# Patient Record
Sex: Male | Born: 1935 | Race: Black or African American | Hispanic: No | Marital: Married
Health system: Southern US, Community
[De-identification: ages and names within clinical notes are randomized; demographics above are authoritative.]

## PROBLEM LIST (undated history)

## (undated) DIAGNOSIS — R6520 Severe sepsis without septic shock: Secondary | ICD-10-CM

## (undated) DIAGNOSIS — J690 Pneumonitis due to inhalation of food and vomit: Secondary | ICD-10-CM

## (undated) DIAGNOSIS — F0390 Unspecified dementia without behavioral disturbance: Secondary | ICD-10-CM

## (undated) DIAGNOSIS — A419 Sepsis, unspecified organism: Secondary | ICD-10-CM

## (undated) DIAGNOSIS — J9621 Acute and chronic respiratory failure with hypoxia: Secondary | ICD-10-CM

---

## 2009-06-06 ENCOUNTER — Emergency Department (HOSPITAL_COMMUNITY): Admission: EM | Admit: 2009-06-06 | Discharge: 2009-06-06 | Payer: Self-pay | Admitting: Emergency Medicine

## 2011-03-11 DIAGNOSIS — Z23 Encounter for immunization: Secondary | ICD-10-CM | POA: Diagnosis not present

## 2011-05-11 DIAGNOSIS — H4011X0 Primary open-angle glaucoma, stage unspecified: Secondary | ICD-10-CM | POA: Diagnosis not present

## 2011-07-05 DIAGNOSIS — E785 Hyperlipidemia, unspecified: Secondary | ICD-10-CM | POA: Diagnosis not present

## 2011-07-05 DIAGNOSIS — Z6825 Body mass index (BMI) 25.0-25.9, adult: Secondary | ICD-10-CM | POA: Diagnosis not present

## 2011-07-05 DIAGNOSIS — C61 Malignant neoplasm of prostate: Secondary | ICD-10-CM | POA: Diagnosis not present

## 2011-07-05 DIAGNOSIS — E039 Hypothyroidism, unspecified: Secondary | ICD-10-CM | POA: Diagnosis not present

## 2011-08-02 DIAGNOSIS — G309 Alzheimer's disease, unspecified: Secondary | ICD-10-CM | POA: Diagnosis not present

## 2011-08-02 DIAGNOSIS — F0280 Dementia in other diseases classified elsewhere without behavioral disturbance: Secondary | ICD-10-CM | POA: Diagnosis not present

## 2011-08-02 DIAGNOSIS — Z6825 Body mass index (BMI) 25.0-25.9, adult: Secondary | ICD-10-CM | POA: Diagnosis not present

## 2011-08-07 DIAGNOSIS — R5381 Other malaise: Secondary | ICD-10-CM | POA: Diagnosis not present

## 2011-08-07 DIAGNOSIS — N390 Urinary tract infection, site not specified: Secondary | ICD-10-CM | POA: Diagnosis not present

## 2011-08-07 DIAGNOSIS — C61 Malignant neoplasm of prostate: Secondary | ICD-10-CM | POA: Diagnosis not present

## 2011-08-07 DIAGNOSIS — N529 Male erectile dysfunction, unspecified: Secondary | ICD-10-CM | POA: Diagnosis not present

## 2011-08-07 DIAGNOSIS — R5383 Other fatigue: Secondary | ICD-10-CM | POA: Diagnosis not present

## 2011-08-21 DIAGNOSIS — H4011X0 Primary open-angle glaucoma, stage unspecified: Secondary | ICD-10-CM | POA: Diagnosis not present

## 2011-08-26 DIAGNOSIS — S0003XA Contusion of scalp, initial encounter: Secondary | ICD-10-CM | POA: Diagnosis not present

## 2011-08-26 DIAGNOSIS — W010XXA Fall on same level from slipping, tripping and stumbling without subsequent striking against object, initial encounter: Secondary | ICD-10-CM | POA: Diagnosis not present

## 2011-08-26 DIAGNOSIS — S1093XA Contusion of unspecified part of neck, initial encounter: Secondary | ICD-10-CM | POA: Diagnosis not present

## 2011-08-26 DIAGNOSIS — S0083XA Contusion of other part of head, initial encounter: Secondary | ICD-10-CM | POA: Diagnosis not present

## 2011-08-30 DIAGNOSIS — S0003XA Contusion of scalp, initial encounter: Secondary | ICD-10-CM | POA: Diagnosis not present

## 2011-08-30 DIAGNOSIS — G309 Alzheimer's disease, unspecified: Secondary | ICD-10-CM | POA: Diagnosis not present

## 2011-08-30 DIAGNOSIS — Z6825 Body mass index (BMI) 25.0-25.9, adult: Secondary | ICD-10-CM | POA: Diagnosis not present

## 2011-08-30 DIAGNOSIS — S1093XA Contusion of unspecified part of neck, initial encounter: Secondary | ICD-10-CM | POA: Diagnosis not present

## 2011-09-27 DIAGNOSIS — E785 Hyperlipidemia, unspecified: Secondary | ICD-10-CM | POA: Diagnosis not present

## 2011-09-27 DIAGNOSIS — E039 Hypothyroidism, unspecified: Secondary | ICD-10-CM | POA: Diagnosis not present

## 2011-09-27 DIAGNOSIS — F0280 Dementia in other diseases classified elsewhere without behavioral disturbance: Secondary | ICD-10-CM | POA: Diagnosis not present

## 2011-09-27 DIAGNOSIS — G309 Alzheimer's disease, unspecified: Secondary | ICD-10-CM | POA: Diagnosis not present

## 2011-09-27 DIAGNOSIS — IMO0002 Reserved for concepts with insufficient information to code with codable children: Secondary | ICD-10-CM | POA: Diagnosis not present

## 2011-10-19 DIAGNOSIS — L600 Ingrowing nail: Secondary | ICD-10-CM | POA: Diagnosis not present

## 2011-11-01 DIAGNOSIS — F0280 Dementia in other diseases classified elsewhere without behavioral disturbance: Secondary | ICD-10-CM | POA: Diagnosis not present

## 2011-11-01 DIAGNOSIS — G309 Alzheimer's disease, unspecified: Secondary | ICD-10-CM | POA: Diagnosis not present

## 2011-11-01 DIAGNOSIS — R42 Dizziness and giddiness: Secondary | ICD-10-CM | POA: Diagnosis not present

## 2011-11-02 DIAGNOSIS — L600 Ingrowing nail: Secondary | ICD-10-CM | POA: Diagnosis not present

## 2011-11-03 DIAGNOSIS — R42 Dizziness and giddiness: Secondary | ICD-10-CM | POA: Diagnosis not present

## 2011-11-03 DIAGNOSIS — G309 Alzheimer's disease, unspecified: Secondary | ICD-10-CM | POA: Diagnosis not present

## 2011-11-03 DIAGNOSIS — F0280 Dementia in other diseases classified elsewhere without behavioral disturbance: Secondary | ICD-10-CM | POA: Diagnosis not present

## 2011-11-03 DIAGNOSIS — Z8673 Personal history of transient ischemic attack (TIA), and cerebral infarction without residual deficits: Secondary | ICD-10-CM | POA: Diagnosis not present

## 2011-11-03 DIAGNOSIS — R930 Abnormal findings on diagnostic imaging of skull and head, not elsewhere classified: Secondary | ICD-10-CM | POA: Diagnosis not present

## 2011-11-07 DIAGNOSIS — F0280 Dementia in other diseases classified elsewhere without behavioral disturbance: Secondary | ICD-10-CM | POA: Diagnosis not present

## 2011-11-07 DIAGNOSIS — G309 Alzheimer's disease, unspecified: Secondary | ICD-10-CM | POA: Diagnosis not present

## 2011-11-07 DIAGNOSIS — R42 Dizziness and giddiness: Secondary | ICD-10-CM | POA: Diagnosis not present

## 2011-11-22 DIAGNOSIS — H4011X0 Primary open-angle glaucoma, stage unspecified: Secondary | ICD-10-CM | POA: Diagnosis not present

## 2011-11-23 DIAGNOSIS — Z23 Encounter for immunization: Secondary | ICD-10-CM | POA: Diagnosis not present

## 2011-12-19 DIAGNOSIS — R42 Dizziness and giddiness: Secondary | ICD-10-CM | POA: Diagnosis not present

## 2011-12-19 DIAGNOSIS — G309 Alzheimer's disease, unspecified: Secondary | ICD-10-CM | POA: Diagnosis not present

## 2011-12-19 DIAGNOSIS — F0280 Dementia in other diseases classified elsewhere without behavioral disturbance: Secondary | ICD-10-CM | POA: Diagnosis not present

## 2012-01-04 DIAGNOSIS — G309 Alzheimer's disease, unspecified: Secondary | ICD-10-CM | POA: Diagnosis not present

## 2012-01-04 DIAGNOSIS — F0280 Dementia in other diseases classified elsewhere without behavioral disturbance: Secondary | ICD-10-CM | POA: Diagnosis not present

## 2012-01-04 DIAGNOSIS — E039 Hypothyroidism, unspecified: Secondary | ICD-10-CM | POA: Diagnosis not present

## 2012-01-04 DIAGNOSIS — Z6827 Body mass index (BMI) 27.0-27.9, adult: Secondary | ICD-10-CM | POA: Diagnosis not present

## 2012-01-04 DIAGNOSIS — E785 Hyperlipidemia, unspecified: Secondary | ICD-10-CM | POA: Diagnosis not present

## 2012-02-05 DIAGNOSIS — C61 Malignant neoplasm of prostate: Secondary | ICD-10-CM | POA: Diagnosis not present

## 2012-02-05 DIAGNOSIS — R5381 Other malaise: Secondary | ICD-10-CM | POA: Diagnosis not present

## 2012-02-05 DIAGNOSIS — R5383 Other fatigue: Secondary | ICD-10-CM | POA: Diagnosis not present

## 2012-02-05 DIAGNOSIS — N529 Male erectile dysfunction, unspecified: Secondary | ICD-10-CM | POA: Diagnosis not present

## 2012-02-05 DIAGNOSIS — N390 Urinary tract infection, site not specified: Secondary | ICD-10-CM | POA: Diagnosis not present

## 2012-02-23 DIAGNOSIS — H4011X0 Primary open-angle glaucoma, stage unspecified: Secondary | ICD-10-CM | POA: Diagnosis not present

## 2012-02-27 DIAGNOSIS — G309 Alzheimer's disease, unspecified: Secondary | ICD-10-CM | POA: Diagnosis not present

## 2012-02-27 DIAGNOSIS — R42 Dizziness and giddiness: Secondary | ICD-10-CM | POA: Diagnosis not present

## 2012-03-22 DIAGNOSIS — Z6826 Body mass index (BMI) 26.0-26.9, adult: Secondary | ICD-10-CM | POA: Diagnosis not present

## 2012-03-22 DIAGNOSIS — E039 Hypothyroidism, unspecified: Secondary | ICD-10-CM | POA: Diagnosis not present

## 2012-04-17 DIAGNOSIS — E039 Hypothyroidism, unspecified: Secondary | ICD-10-CM | POA: Diagnosis not present

## 2012-05-23 DIAGNOSIS — H4011X0 Primary open-angle glaucoma, stage unspecified: Secondary | ICD-10-CM | POA: Diagnosis not present

## 2012-08-02 DIAGNOSIS — Z6826 Body mass index (BMI) 26.0-26.9, adult: Secondary | ICD-10-CM | POA: Diagnosis not present

## 2012-08-02 DIAGNOSIS — E785 Hyperlipidemia, unspecified: Secondary | ICD-10-CM | POA: Diagnosis not present

## 2012-08-02 DIAGNOSIS — Z9181 History of falling: Secondary | ICD-10-CM | POA: Diagnosis not present

## 2012-08-02 DIAGNOSIS — G309 Alzheimer's disease, unspecified: Secondary | ICD-10-CM | POA: Diagnosis not present

## 2012-08-02 DIAGNOSIS — Z1331 Encounter for screening for depression: Secondary | ICD-10-CM | POA: Diagnosis not present

## 2012-08-02 DIAGNOSIS — F0280 Dementia in other diseases classified elsewhere without behavioral disturbance: Secondary | ICD-10-CM | POA: Diagnosis not present

## 2012-08-02 DIAGNOSIS — E039 Hypothyroidism, unspecified: Secondary | ICD-10-CM | POA: Diagnosis not present

## 2012-08-05 DIAGNOSIS — N390 Urinary tract infection, site not specified: Secondary | ICD-10-CM | POA: Diagnosis not present

## 2012-08-05 DIAGNOSIS — C61 Malignant neoplasm of prostate: Secondary | ICD-10-CM | POA: Diagnosis not present

## 2012-08-05 DIAGNOSIS — R972 Elevated prostate specific antigen [PSA]: Secondary | ICD-10-CM | POA: Diagnosis not present

## 2012-08-05 DIAGNOSIS — R5381 Other malaise: Secondary | ICD-10-CM | POA: Diagnosis not present

## 2012-08-05 DIAGNOSIS — R5383 Other fatigue: Secondary | ICD-10-CM | POA: Diagnosis not present

## 2012-08-26 DIAGNOSIS — H4011X0 Primary open-angle glaucoma, stage unspecified: Secondary | ICD-10-CM | POA: Diagnosis not present

## 2012-11-06 DIAGNOSIS — Z6826 Body mass index (BMI) 26.0-26.9, adult: Secondary | ICD-10-CM | POA: Diagnosis not present

## 2012-11-06 DIAGNOSIS — F0280 Dementia in other diseases classified elsewhere without behavioral disturbance: Secondary | ICD-10-CM | POA: Diagnosis not present

## 2012-11-06 DIAGNOSIS — E785 Hyperlipidemia, unspecified: Secondary | ICD-10-CM | POA: Diagnosis not present

## 2012-11-06 DIAGNOSIS — E039 Hypothyroidism, unspecified: Secondary | ICD-10-CM | POA: Diagnosis not present

## 2012-11-06 DIAGNOSIS — H6120 Impacted cerumen, unspecified ear: Secondary | ICD-10-CM | POA: Diagnosis not present

## 2012-11-26 DIAGNOSIS — H4011X0 Primary open-angle glaucoma, stage unspecified: Secondary | ICD-10-CM | POA: Diagnosis not present

## 2013-01-01 DIAGNOSIS — Z23 Encounter for immunization: Secondary | ICD-10-CM | POA: Diagnosis not present

## 2013-02-04 DIAGNOSIS — R5383 Other fatigue: Secondary | ICD-10-CM | POA: Diagnosis not present

## 2013-02-04 DIAGNOSIS — C61 Malignant neoplasm of prostate: Secondary | ICD-10-CM | POA: Diagnosis not present

## 2013-02-04 DIAGNOSIS — R5381 Other malaise: Secondary | ICD-10-CM | POA: Diagnosis not present

## 2013-02-04 DIAGNOSIS — N529 Male erectile dysfunction, unspecified: Secondary | ICD-10-CM | POA: Diagnosis not present

## 2013-02-04 DIAGNOSIS — N390 Urinary tract infection, site not specified: Secondary | ICD-10-CM | POA: Diagnosis not present

## 2013-02-17 DIAGNOSIS — E785 Hyperlipidemia, unspecified: Secondary | ICD-10-CM | POA: Diagnosis not present

## 2013-02-17 DIAGNOSIS — F0280 Dementia in other diseases classified elsewhere without behavioral disturbance: Secondary | ICD-10-CM | POA: Diagnosis not present

## 2013-02-17 DIAGNOSIS — E039 Hypothyroidism, unspecified: Secondary | ICD-10-CM | POA: Diagnosis not present

## 2013-02-26 DIAGNOSIS — H4011X0 Primary open-angle glaucoma, stage unspecified: Secondary | ICD-10-CM | POA: Diagnosis not present

## 2013-05-20 DIAGNOSIS — Z6827 Body mass index (BMI) 27.0-27.9, adult: Secondary | ICD-10-CM | POA: Diagnosis not present

## 2013-05-20 DIAGNOSIS — E039 Hypothyroidism, unspecified: Secondary | ICD-10-CM | POA: Diagnosis not present

## 2013-05-20 DIAGNOSIS — C61 Malignant neoplasm of prostate: Secondary | ICD-10-CM | POA: Diagnosis not present

## 2013-05-20 DIAGNOSIS — E785 Hyperlipidemia, unspecified: Secondary | ICD-10-CM | POA: Diagnosis not present

## 2013-05-20 DIAGNOSIS — F0280 Dementia in other diseases classified elsewhere without behavioral disturbance: Secondary | ICD-10-CM | POA: Diagnosis not present

## 2013-05-20 DIAGNOSIS — G309 Alzheimer's disease, unspecified: Secondary | ICD-10-CM | POA: Diagnosis not present

## 2013-06-05 DIAGNOSIS — H4011X0 Primary open-angle glaucoma, stage unspecified: Secondary | ICD-10-CM | POA: Diagnosis not present

## 2013-08-05 DIAGNOSIS — R5383 Other fatigue: Secondary | ICD-10-CM | POA: Diagnosis not present

## 2013-08-05 DIAGNOSIS — R5381 Other malaise: Secondary | ICD-10-CM | POA: Diagnosis not present

## 2013-08-05 DIAGNOSIS — C61 Malignant neoplasm of prostate: Secondary | ICD-10-CM | POA: Diagnosis not present

## 2013-08-05 DIAGNOSIS — N390 Urinary tract infection, site not specified: Secondary | ICD-10-CM | POA: Diagnosis not present

## 2013-08-05 DIAGNOSIS — R972 Elevated prostate specific antigen [PSA]: Secondary | ICD-10-CM | POA: Diagnosis not present

## 2013-08-25 DIAGNOSIS — G309 Alzheimer's disease, unspecified: Secondary | ICD-10-CM | POA: Diagnosis not present

## 2013-08-25 DIAGNOSIS — F0280 Dementia in other diseases classified elsewhere without behavioral disturbance: Secondary | ICD-10-CM | POA: Diagnosis not present

## 2013-08-25 DIAGNOSIS — E039 Hypothyroidism, unspecified: Secondary | ICD-10-CM | POA: Diagnosis not present

## 2013-08-25 DIAGNOSIS — Z9181 History of falling: Secondary | ICD-10-CM | POA: Diagnosis not present

## 2013-08-25 DIAGNOSIS — Z6827 Body mass index (BMI) 27.0-27.9, adult: Secondary | ICD-10-CM | POA: Diagnosis not present

## 2013-08-25 DIAGNOSIS — Z1331 Encounter for screening for depression: Secondary | ICD-10-CM | POA: Diagnosis not present

## 2013-08-25 DIAGNOSIS — E785 Hyperlipidemia, unspecified: Secondary | ICD-10-CM | POA: Diagnosis not present

## 2013-08-25 DIAGNOSIS — C61 Malignant neoplasm of prostate: Secondary | ICD-10-CM | POA: Diagnosis not present

## 2013-09-04 DIAGNOSIS — H4011X0 Primary open-angle glaucoma, stage unspecified: Secondary | ICD-10-CM | POA: Diagnosis not present

## 2013-09-17 DIAGNOSIS — E559 Vitamin D deficiency, unspecified: Secondary | ICD-10-CM | POA: Diagnosis not present

## 2013-09-17 DIAGNOSIS — G4710 Hypersomnia, unspecified: Secondary | ICD-10-CM | POA: Diagnosis not present

## 2013-09-17 DIAGNOSIS — R5383 Other fatigue: Secondary | ICD-10-CM | POA: Diagnosis not present

## 2013-09-17 DIAGNOSIS — G4730 Sleep apnea, unspecified: Secondary | ICD-10-CM | POA: Diagnosis not present

## 2013-09-17 DIAGNOSIS — R5381 Other malaise: Secondary | ICD-10-CM | POA: Diagnosis not present

## 2013-09-24 DIAGNOSIS — Z6827 Body mass index (BMI) 27.0-27.9, adult: Secondary | ICD-10-CM | POA: Diagnosis not present

## 2013-09-24 DIAGNOSIS — G4733 Obstructive sleep apnea (adult) (pediatric): Secondary | ICD-10-CM | POA: Diagnosis not present

## 2013-09-24 DIAGNOSIS — E039 Hypothyroidism, unspecified: Secondary | ICD-10-CM | POA: Diagnosis not present

## 2013-09-29 DIAGNOSIS — G4710 Hypersomnia, unspecified: Secondary | ICD-10-CM | POA: Diagnosis not present

## 2013-09-29 DIAGNOSIS — G4730 Sleep apnea, unspecified: Secondary | ICD-10-CM | POA: Diagnosis not present

## 2013-09-30 DIAGNOSIS — G4710 Hypersomnia, unspecified: Secondary | ICD-10-CM | POA: Diagnosis not present

## 2013-09-30 DIAGNOSIS — G4730 Sleep apnea, unspecified: Secondary | ICD-10-CM | POA: Diagnosis not present

## 2013-11-28 DIAGNOSIS — C61 Malignant neoplasm of prostate: Secondary | ICD-10-CM | POA: Diagnosis not present

## 2013-11-28 DIAGNOSIS — G309 Alzheimer's disease, unspecified: Secondary | ICD-10-CM | POA: Diagnosis not present

## 2013-11-28 DIAGNOSIS — E785 Hyperlipidemia, unspecified: Secondary | ICD-10-CM | POA: Diagnosis not present

## 2013-11-28 DIAGNOSIS — Z23 Encounter for immunization: Secondary | ICD-10-CM | POA: Diagnosis not present

## 2013-11-28 DIAGNOSIS — E039 Hypothyroidism, unspecified: Secondary | ICD-10-CM | POA: Diagnosis not present

## 2013-11-28 DIAGNOSIS — G4733 Obstructive sleep apnea (adult) (pediatric): Secondary | ICD-10-CM | POA: Diagnosis not present

## 2013-11-28 DIAGNOSIS — G039 Meningitis, unspecified: Secondary | ICD-10-CM | POA: Diagnosis not present

## 2013-11-28 DIAGNOSIS — Z6827 Body mass index (BMI) 27.0-27.9, adult: Secondary | ICD-10-CM | POA: Diagnosis not present

## 2013-12-11 DIAGNOSIS — H4011X1 Primary open-angle glaucoma, mild stage: Secondary | ICD-10-CM | POA: Diagnosis not present

## 2013-12-12 DIAGNOSIS — H4011X1 Primary open-angle glaucoma, mild stage: Secondary | ICD-10-CM | POA: Diagnosis not present

## 2013-12-24 DIAGNOSIS — H4011X1 Primary open-angle glaucoma, mild stage: Secondary | ICD-10-CM | POA: Diagnosis not present

## 2014-02-02 DIAGNOSIS — R351 Nocturia: Secondary | ICD-10-CM | POA: Diagnosis not present

## 2014-02-02 DIAGNOSIS — E291 Testicular hypofunction: Secondary | ICD-10-CM | POA: Diagnosis not present

## 2014-02-02 DIAGNOSIS — C61 Malignant neoplasm of prostate: Secondary | ICD-10-CM | POA: Diagnosis not present

## 2014-02-02 DIAGNOSIS — N5203 Combined arterial insufficiency and corporo-venous occlusive erectile dysfunction: Secondary | ICD-10-CM | POA: Diagnosis not present

## 2014-02-02 DIAGNOSIS — N3090 Cystitis, unspecified without hematuria: Secondary | ICD-10-CM | POA: Diagnosis not present

## 2014-03-05 DIAGNOSIS — E785 Hyperlipidemia, unspecified: Secondary | ICD-10-CM | POA: Diagnosis not present

## 2014-03-05 DIAGNOSIS — N529 Male erectile dysfunction, unspecified: Secondary | ICD-10-CM | POA: Diagnosis not present

## 2014-03-05 DIAGNOSIS — G309 Alzheimer's disease, unspecified: Secondary | ICD-10-CM | POA: Diagnosis not present

## 2014-03-05 DIAGNOSIS — Z6827 Body mass index (BMI) 27.0-27.9, adult: Secondary | ICD-10-CM | POA: Diagnosis not present

## 2014-03-05 DIAGNOSIS — E039 Hypothyroidism, unspecified: Secondary | ICD-10-CM | POA: Diagnosis not present

## 2014-03-05 DIAGNOSIS — Z23 Encounter for immunization: Secondary | ICD-10-CM | POA: Diagnosis not present

## 2014-03-05 DIAGNOSIS — C61 Malignant neoplasm of prostate: Secondary | ICD-10-CM | POA: Diagnosis not present

## 2014-05-27 DIAGNOSIS — Z7982 Long term (current) use of aspirin: Secondary | ICD-10-CM | POA: Diagnosis not present

## 2014-05-27 DIAGNOSIS — Z87891 Personal history of nicotine dependence: Secondary | ICD-10-CM | POA: Diagnosis not present

## 2014-05-27 DIAGNOSIS — T17298A Other foreign object in pharynx causing other injury, initial encounter: Secondary | ICD-10-CM | POA: Diagnosis not present

## 2014-05-27 DIAGNOSIS — T189XXA Foreign body of alimentary tract, part unspecified, initial encounter: Secondary | ICD-10-CM | POA: Diagnosis not present

## 2014-05-27 DIAGNOSIS — T182XXA Foreign body in stomach, initial encounter: Secondary | ICD-10-CM | POA: Diagnosis not present

## 2014-05-27 DIAGNOSIS — E780 Pure hypercholesterolemia: Secondary | ICD-10-CM | POA: Diagnosis not present

## 2014-06-11 DIAGNOSIS — E785 Hyperlipidemia, unspecified: Secondary | ICD-10-CM | POA: Diagnosis not present

## 2014-06-11 DIAGNOSIS — Z6827 Body mass index (BMI) 27.0-27.9, adult: Secondary | ICD-10-CM | POA: Diagnosis not present

## 2014-06-11 DIAGNOSIS — E039 Hypothyroidism, unspecified: Secondary | ICD-10-CM | POA: Diagnosis not present

## 2014-06-11 DIAGNOSIS — G309 Alzheimer's disease, unspecified: Secondary | ICD-10-CM | POA: Diagnosis not present

## 2014-06-11 DIAGNOSIS — N183 Chronic kidney disease, stage 3 (moderate): Secondary | ICD-10-CM | POA: Diagnosis not present

## 2014-08-03 DIAGNOSIS — C61 Malignant neoplasm of prostate: Secondary | ICD-10-CM | POA: Diagnosis not present

## 2014-08-03 DIAGNOSIS — E291 Testicular hypofunction: Secondary | ICD-10-CM | POA: Diagnosis not present

## 2014-08-03 DIAGNOSIS — R351 Nocturia: Secondary | ICD-10-CM | POA: Diagnosis not present

## 2014-08-03 DIAGNOSIS — N3090 Cystitis, unspecified without hematuria: Secondary | ICD-10-CM | POA: Diagnosis not present

## 2014-08-03 DIAGNOSIS — N5203 Combined arterial insufficiency and corporo-venous occlusive erectile dysfunction: Secondary | ICD-10-CM | POA: Diagnosis not present

## 2014-09-14 DIAGNOSIS — Z1389 Encounter for screening for other disorder: Secondary | ICD-10-CM | POA: Diagnosis not present

## 2014-09-14 DIAGNOSIS — E039 Hypothyroidism, unspecified: Secondary | ICD-10-CM | POA: Diagnosis not present

## 2014-09-14 DIAGNOSIS — Z9181 History of falling: Secondary | ICD-10-CM | POA: Diagnosis not present

## 2014-09-14 DIAGNOSIS — Z6826 Body mass index (BMI) 26.0-26.9, adult: Secondary | ICD-10-CM | POA: Diagnosis not present

## 2014-09-14 DIAGNOSIS — N183 Chronic kidney disease, stage 3 (moderate): Secondary | ICD-10-CM | POA: Diagnosis not present

## 2014-09-14 DIAGNOSIS — E785 Hyperlipidemia, unspecified: Secondary | ICD-10-CM | POA: Diagnosis not present

## 2014-09-14 DIAGNOSIS — G309 Alzheimer's disease, unspecified: Secondary | ICD-10-CM | POA: Diagnosis not present

## 2014-12-03 DIAGNOSIS — Z23 Encounter for immunization: Secondary | ICD-10-CM | POA: Diagnosis not present

## 2014-12-17 DIAGNOSIS — E039 Hypothyroidism, unspecified: Secondary | ICD-10-CM | POA: Diagnosis not present

## 2014-12-17 DIAGNOSIS — Z6826 Body mass index (BMI) 26.0-26.9, adult: Secondary | ICD-10-CM | POA: Diagnosis not present

## 2014-12-17 DIAGNOSIS — G309 Alzheimer's disease, unspecified: Secondary | ICD-10-CM | POA: Diagnosis not present

## 2014-12-17 DIAGNOSIS — E785 Hyperlipidemia, unspecified: Secondary | ICD-10-CM | POA: Diagnosis not present

## 2014-12-17 DIAGNOSIS — N183 Chronic kidney disease, stage 3 (moderate): Secondary | ICD-10-CM | POA: Diagnosis not present

## 2015-02-02 DIAGNOSIS — N5203 Combined arterial insufficiency and corporo-venous occlusive erectile dysfunction: Secondary | ICD-10-CM | POA: Diagnosis not present

## 2015-02-02 DIAGNOSIS — N3020 Other chronic cystitis without hematuria: Secondary | ICD-10-CM | POA: Diagnosis not present

## 2015-02-02 DIAGNOSIS — R351 Nocturia: Secondary | ICD-10-CM | POA: Diagnosis not present

## 2015-02-02 DIAGNOSIS — C61 Malignant neoplasm of prostate: Secondary | ICD-10-CM | POA: Diagnosis not present

## 2015-03-25 DIAGNOSIS — E785 Hyperlipidemia, unspecified: Secondary | ICD-10-CM | POA: Diagnosis not present

## 2015-03-25 DIAGNOSIS — N183 Chronic kidney disease, stage 3 (moderate): Secondary | ICD-10-CM | POA: Diagnosis not present

## 2015-03-25 DIAGNOSIS — E039 Hypothyroidism, unspecified: Secondary | ICD-10-CM | POA: Diagnosis not present

## 2015-03-25 DIAGNOSIS — G309 Alzheimer's disease, unspecified: Secondary | ICD-10-CM | POA: Diagnosis not present

## 2015-03-25 DIAGNOSIS — Z6826 Body mass index (BMI) 26.0-26.9, adult: Secondary | ICD-10-CM | POA: Diagnosis not present

## 2015-06-29 DIAGNOSIS — E039 Hypothyroidism, unspecified: Secondary | ICD-10-CM | POA: Diagnosis not present

## 2015-06-29 DIAGNOSIS — G309 Alzheimer's disease, unspecified: Secondary | ICD-10-CM | POA: Diagnosis not present

## 2015-06-29 DIAGNOSIS — Z6826 Body mass index (BMI) 26.0-26.9, adult: Secondary | ICD-10-CM | POA: Diagnosis not present

## 2015-06-29 DIAGNOSIS — E663 Overweight: Secondary | ICD-10-CM | POA: Diagnosis not present

## 2015-06-29 DIAGNOSIS — N183 Chronic kidney disease, stage 3 (moderate): Secondary | ICD-10-CM | POA: Diagnosis not present

## 2015-06-29 DIAGNOSIS — E785 Hyperlipidemia, unspecified: Secondary | ICD-10-CM | POA: Diagnosis not present

## 2015-08-19 DIAGNOSIS — R351 Nocturia: Secondary | ICD-10-CM | POA: Diagnosis not present

## 2015-08-19 DIAGNOSIS — E291 Testicular hypofunction: Secondary | ICD-10-CM | POA: Diagnosis not present

## 2015-08-19 DIAGNOSIS — C61 Malignant neoplasm of prostate: Secondary | ICD-10-CM | POA: Diagnosis not present

## 2015-08-19 DIAGNOSIS — N3020 Other chronic cystitis without hematuria: Secondary | ICD-10-CM | POA: Diagnosis not present

## 2015-08-19 DIAGNOSIS — N5203 Combined arterial insufficiency and corporo-venous occlusive erectile dysfunction: Secondary | ICD-10-CM | POA: Diagnosis not present

## 2015-10-06 DIAGNOSIS — Z6827 Body mass index (BMI) 27.0-27.9, adult: Secondary | ICD-10-CM | POA: Diagnosis not present

## 2015-10-06 DIAGNOSIS — Z1389 Encounter for screening for other disorder: Secondary | ICD-10-CM | POA: Diagnosis not present

## 2015-10-06 DIAGNOSIS — Z9181 History of falling: Secondary | ICD-10-CM | POA: Diagnosis not present

## 2015-10-06 DIAGNOSIS — E785 Hyperlipidemia, unspecified: Secondary | ICD-10-CM | POA: Diagnosis not present

## 2015-10-06 DIAGNOSIS — G309 Alzheimer's disease, unspecified: Secondary | ICD-10-CM | POA: Diagnosis not present

## 2015-10-06 DIAGNOSIS — N183 Chronic kidney disease, stage 3 (moderate): Secondary | ICD-10-CM | POA: Diagnosis not present

## 2015-10-06 DIAGNOSIS — E039 Hypothyroidism, unspecified: Secondary | ICD-10-CM | POA: Diagnosis not present

## 2015-11-08 DIAGNOSIS — E039 Hypothyroidism, unspecified: Secondary | ICD-10-CM | POA: Diagnosis not present

## 2015-11-25 DIAGNOSIS — H4010X2 Unspecified open-angle glaucoma, moderate stage: Secondary | ICD-10-CM | POA: Diagnosis not present

## 2015-12-06 DIAGNOSIS — E039 Hypothyroidism, unspecified: Secondary | ICD-10-CM | POA: Diagnosis not present

## 2015-12-29 DIAGNOSIS — H401131 Primary open-angle glaucoma, bilateral, mild stage: Secondary | ICD-10-CM | POA: Diagnosis not present

## 2016-01-17 DIAGNOSIS — E785 Hyperlipidemia, unspecified: Secondary | ICD-10-CM | POA: Diagnosis not present

## 2016-01-17 DIAGNOSIS — Z23 Encounter for immunization: Secondary | ICD-10-CM | POA: Diagnosis not present

## 2016-01-17 DIAGNOSIS — N183 Chronic kidney disease, stage 3 (moderate): Secondary | ICD-10-CM | POA: Diagnosis not present

## 2016-01-17 DIAGNOSIS — G309 Alzheimer's disease, unspecified: Secondary | ICD-10-CM | POA: Diagnosis not present

## 2016-01-17 DIAGNOSIS — E039 Hypothyroidism, unspecified: Secondary | ICD-10-CM | POA: Diagnosis not present

## 2016-01-17 DIAGNOSIS — Z6826 Body mass index (BMI) 26.0-26.9, adult: Secondary | ICD-10-CM | POA: Diagnosis not present

## 2016-02-15 DIAGNOSIS — N538 Other male sexual dysfunction: Secondary | ICD-10-CM | POA: Diagnosis not present

## 2016-02-15 DIAGNOSIS — C61 Malignant neoplasm of prostate: Secondary | ICD-10-CM | POA: Diagnosis not present

## 2016-02-15 DIAGNOSIS — N3000 Acute cystitis without hematuria: Secondary | ICD-10-CM | POA: Diagnosis not present

## 2016-03-28 DIAGNOSIS — R42 Dizziness and giddiness: Secondary | ICD-10-CM | POA: Diagnosis not present

## 2016-03-28 DIAGNOSIS — J322 Chronic ethmoidal sinusitis: Secondary | ICD-10-CM | POA: Diagnosis not present

## 2016-03-28 DIAGNOSIS — S0990XA Unspecified injury of head, initial encounter: Secondary | ICD-10-CM | POA: Diagnosis not present

## 2016-03-28 DIAGNOSIS — M545 Low back pain: Secondary | ICD-10-CM | POA: Diagnosis not present

## 2016-03-28 DIAGNOSIS — S3992XA Unspecified injury of lower back, initial encounter: Secondary | ICD-10-CM | POA: Diagnosis not present

## 2016-03-28 DIAGNOSIS — S3993XA Unspecified injury of pelvis, initial encounter: Secondary | ICD-10-CM | POA: Diagnosis not present

## 2016-03-28 DIAGNOSIS — J111 Influenza due to unidentified influenza virus with other respiratory manifestations: Secondary | ICD-10-CM | POA: Diagnosis not present

## 2016-03-28 DIAGNOSIS — R079 Chest pain, unspecified: Secondary | ICD-10-CM | POA: Diagnosis not present

## 2016-03-28 DIAGNOSIS — R102 Pelvic and perineal pain: Secondary | ICD-10-CM | POA: Diagnosis not present

## 2016-04-19 DIAGNOSIS — Z6825 Body mass index (BMI) 25.0-25.9, adult: Secondary | ICD-10-CM | POA: Diagnosis not present

## 2016-04-19 DIAGNOSIS — E039 Hypothyroidism, unspecified: Secondary | ICD-10-CM | POA: Diagnosis not present

## 2016-04-19 DIAGNOSIS — E785 Hyperlipidemia, unspecified: Secondary | ICD-10-CM | POA: Diagnosis not present

## 2016-04-19 DIAGNOSIS — N183 Chronic kidney disease, stage 3 (moderate): Secondary | ICD-10-CM | POA: Diagnosis not present

## 2016-04-19 DIAGNOSIS — G309 Alzheimer's disease, unspecified: Secondary | ICD-10-CM | POA: Diagnosis not present

## 2016-04-27 DIAGNOSIS — Z1389 Encounter for screening for other disorder: Secondary | ICD-10-CM | POA: Diagnosis not present

## 2016-04-27 DIAGNOSIS — Z9181 History of falling: Secondary | ICD-10-CM | POA: Diagnosis not present

## 2016-04-27 DIAGNOSIS — E785 Hyperlipidemia, unspecified: Secondary | ICD-10-CM | POA: Diagnosis not present

## 2016-04-27 DIAGNOSIS — Z136 Encounter for screening for cardiovascular disorders: Secondary | ICD-10-CM | POA: Diagnosis not present

## 2016-04-27 DIAGNOSIS — Z125 Encounter for screening for malignant neoplasm of prostate: Secondary | ICD-10-CM | POA: Diagnosis not present

## 2016-04-27 DIAGNOSIS — Z0000 Encounter for general adult medical examination without abnormal findings: Secondary | ICD-10-CM | POA: Diagnosis not present

## 2016-05-22 DIAGNOSIS — E039 Hypothyroidism, unspecified: Secondary | ICD-10-CM | POA: Diagnosis not present

## 2016-07-19 DIAGNOSIS — H6121 Impacted cerumen, right ear: Secondary | ICD-10-CM | POA: Diagnosis not present

## 2016-07-19 DIAGNOSIS — E039 Hypothyroidism, unspecified: Secondary | ICD-10-CM | POA: Diagnosis not present

## 2016-07-19 DIAGNOSIS — N183 Chronic kidney disease, stage 3 (moderate): Secondary | ICD-10-CM | POA: Diagnosis not present

## 2016-07-19 DIAGNOSIS — E785 Hyperlipidemia, unspecified: Secondary | ICD-10-CM | POA: Diagnosis not present

## 2016-07-19 DIAGNOSIS — Z6825 Body mass index (BMI) 25.0-25.9, adult: Secondary | ICD-10-CM | POA: Diagnosis not present

## 2016-07-19 DIAGNOSIS — Z23 Encounter for immunization: Secondary | ICD-10-CM | POA: Diagnosis not present

## 2016-07-19 DIAGNOSIS — G309 Alzheimer's disease, unspecified: Secondary | ICD-10-CM | POA: Diagnosis not present

## 2016-08-15 DIAGNOSIS — C61 Malignant neoplasm of prostate: Secondary | ICD-10-CM | POA: Diagnosis not present

## 2016-08-15 DIAGNOSIS — N538 Other male sexual dysfunction: Secondary | ICD-10-CM | POA: Diagnosis not present

## 2016-08-15 DIAGNOSIS — N3000 Acute cystitis without hematuria: Secondary | ICD-10-CM | POA: Diagnosis not present

## 2016-10-26 DIAGNOSIS — G309 Alzheimer's disease, unspecified: Secondary | ICD-10-CM | POA: Diagnosis not present

## 2016-10-26 DIAGNOSIS — Z6825 Body mass index (BMI) 25.0-25.9, adult: Secondary | ICD-10-CM | POA: Diagnosis not present

## 2016-10-26 DIAGNOSIS — E039 Hypothyroidism, unspecified: Secondary | ICD-10-CM | POA: Diagnosis not present

## 2016-10-26 DIAGNOSIS — N183 Chronic kidney disease, stage 3 (moderate): Secondary | ICD-10-CM | POA: Diagnosis not present

## 2016-10-26 DIAGNOSIS — E785 Hyperlipidemia, unspecified: Secondary | ICD-10-CM | POA: Diagnosis not present

## 2016-12-21 DIAGNOSIS — Z23 Encounter for immunization: Secondary | ICD-10-CM | POA: Diagnosis not present

## 2017-02-06 DIAGNOSIS — Z6826 Body mass index (BMI) 26.0-26.9, adult: Secondary | ICD-10-CM | POA: Diagnosis not present

## 2017-02-06 DIAGNOSIS — G309 Alzheimer's disease, unspecified: Secondary | ICD-10-CM | POA: Diagnosis not present

## 2017-02-06 DIAGNOSIS — E039 Hypothyroidism, unspecified: Secondary | ICD-10-CM | POA: Diagnosis not present

## 2017-02-06 DIAGNOSIS — N183 Chronic kidney disease, stage 3 (moderate): Secondary | ICD-10-CM | POA: Diagnosis not present

## 2017-02-06 DIAGNOSIS — E785 Hyperlipidemia, unspecified: Secondary | ICD-10-CM | POA: Diagnosis not present

## 2017-04-30 DIAGNOSIS — Z0000 Encounter for general adult medical examination without abnormal findings: Secondary | ICD-10-CM | POA: Diagnosis not present

## 2017-04-30 DIAGNOSIS — Z9181 History of falling: Secondary | ICD-10-CM | POA: Diagnosis not present

## 2017-04-30 DIAGNOSIS — Z125 Encounter for screening for malignant neoplasm of prostate: Secondary | ICD-10-CM | POA: Diagnosis not present

## 2017-04-30 DIAGNOSIS — Z136 Encounter for screening for cardiovascular disorders: Secondary | ICD-10-CM | POA: Diagnosis not present

## 2017-04-30 DIAGNOSIS — E785 Hyperlipidemia, unspecified: Secondary | ICD-10-CM | POA: Diagnosis not present

## 2017-04-30 DIAGNOSIS — Z1331 Encounter for screening for depression: Secondary | ICD-10-CM | POA: Diagnosis not present

## 2017-05-11 DIAGNOSIS — Z6826 Body mass index (BMI) 26.0-26.9, adult: Secondary | ICD-10-CM | POA: Diagnosis not present

## 2017-05-11 DIAGNOSIS — E785 Hyperlipidemia, unspecified: Secondary | ICD-10-CM | POA: Diagnosis not present

## 2017-05-11 DIAGNOSIS — G309 Alzheimer's disease, unspecified: Secondary | ICD-10-CM | POA: Diagnosis not present

## 2017-05-11 DIAGNOSIS — E039 Hypothyroidism, unspecified: Secondary | ICD-10-CM | POA: Diagnosis not present

## 2017-05-11 DIAGNOSIS — N183 Chronic kidney disease, stage 3 (moderate): Secondary | ICD-10-CM | POA: Diagnosis not present

## 2017-06-12 DIAGNOSIS — E039 Hypothyroidism, unspecified: Secondary | ICD-10-CM | POA: Diagnosis not present

## 2017-07-10 DIAGNOSIS — E039 Hypothyroidism, unspecified: Secondary | ICD-10-CM | POA: Diagnosis not present

## 2017-08-08 DIAGNOSIS — E039 Hypothyroidism, unspecified: Secondary | ICD-10-CM | POA: Diagnosis not present

## 2017-08-16 DIAGNOSIS — N183 Chronic kidney disease, stage 3 (moderate): Secondary | ICD-10-CM | POA: Diagnosis not present

## 2017-08-16 DIAGNOSIS — G309 Alzheimer's disease, unspecified: Secondary | ICD-10-CM | POA: Diagnosis not present

## 2017-08-16 DIAGNOSIS — E785 Hyperlipidemia, unspecified: Secondary | ICD-10-CM | POA: Diagnosis not present

## 2017-08-16 DIAGNOSIS — E039 Hypothyroidism, unspecified: Secondary | ICD-10-CM | POA: Diagnosis not present

## 2017-11-20 DIAGNOSIS — N183 Chronic kidney disease, stage 3 (moderate): Secondary | ICD-10-CM | POA: Diagnosis not present

## 2017-11-20 DIAGNOSIS — Z23 Encounter for immunization: Secondary | ICD-10-CM | POA: Diagnosis not present

## 2017-11-20 DIAGNOSIS — G309 Alzheimer's disease, unspecified: Secondary | ICD-10-CM | POA: Diagnosis not present

## 2017-11-20 DIAGNOSIS — E039 Hypothyroidism, unspecified: Secondary | ICD-10-CM | POA: Diagnosis not present

## 2017-11-20 DIAGNOSIS — E785 Hyperlipidemia, unspecified: Secondary | ICD-10-CM | POA: Diagnosis not present

## 2018-02-20 DIAGNOSIS — J9811 Atelectasis: Secondary | ICD-10-CM | POA: Diagnosis not present

## 2018-02-20 DIAGNOSIS — N183 Chronic kidney disease, stage 3 (moderate): Secondary | ICD-10-CM | POA: Diagnosis present

## 2018-02-20 DIAGNOSIS — J690 Pneumonitis due to inhalation of food and vomit: Secondary | ICD-10-CM | POA: Diagnosis present

## 2018-02-20 DIAGNOSIS — E878 Other disorders of electrolyte and fluid balance, not elsewhere classified: Secondary | ICD-10-CM | POA: Diagnosis not present

## 2018-02-20 DIAGNOSIS — Z66 Do not resuscitate: Secondary | ICD-10-CM | POA: Diagnosis present

## 2018-02-20 DIAGNOSIS — R739 Hyperglycemia, unspecified: Secondary | ICD-10-CM | POA: Diagnosis present

## 2018-02-20 DIAGNOSIS — J189 Pneumonia, unspecified organism: Secondary | ICD-10-CM | POA: Diagnosis not present

## 2018-02-20 DIAGNOSIS — Z4682 Encounter for fitting and adjustment of non-vascular catheter: Secondary | ICD-10-CM | POA: Diagnosis not present

## 2018-02-20 DIAGNOSIS — J9690 Respiratory failure, unspecified, unspecified whether with hypoxia or hypercapnia: Secondary | ICD-10-CM | POA: Diagnosis not present

## 2018-02-20 DIAGNOSIS — K56609 Unspecified intestinal obstruction, unspecified as to partial versus complete obstruction: Secondary | ICD-10-CM | POA: Diagnosis not present

## 2018-02-20 DIAGNOSIS — R000 Tachycardia, unspecified: Secondary | ICD-10-CM | POA: Diagnosis not present

## 2018-02-20 DIAGNOSIS — Z79899 Other long term (current) drug therapy: Secondary | ICD-10-CM | POA: Diagnosis not present

## 2018-02-20 DIAGNOSIS — R1312 Dysphagia, oropharyngeal phase: Secondary | ICD-10-CM | POA: Diagnosis present

## 2018-02-20 DIAGNOSIS — T17218A Gastric contents in pharynx causing other injury, initial encounter: Secondary | ICD-10-CM | POA: Diagnosis not present

## 2018-02-20 DIAGNOSIS — E876 Hypokalemia: Secondary | ICD-10-CM | POA: Diagnosis present

## 2018-02-20 DIAGNOSIS — F0390 Unspecified dementia without behavioral disturbance: Secondary | ICD-10-CM | POA: Diagnosis present

## 2018-02-20 DIAGNOSIS — Z85038 Personal history of other malignant neoplasm of large intestine: Secondary | ICD-10-CM | POA: Diagnosis not present

## 2018-02-20 DIAGNOSIS — K567 Ileus, unspecified: Secondary | ICD-10-CM | POA: Diagnosis present

## 2018-02-20 DIAGNOSIS — Z9989 Dependence on other enabling machines and devices: Secondary | ICD-10-CM | POA: Diagnosis not present

## 2018-02-20 DIAGNOSIS — Z7982 Long term (current) use of aspirin: Secondary | ICD-10-CM | POA: Diagnosis not present

## 2018-02-20 DIAGNOSIS — J9600 Acute respiratory failure, unspecified whether with hypoxia or hypercapnia: Secondary | ICD-10-CM | POA: Diagnosis not present

## 2018-02-20 DIAGNOSIS — I340 Nonrheumatic mitral (valve) insufficiency: Secondary | ICD-10-CM | POA: Diagnosis not present

## 2018-02-20 DIAGNOSIS — E785 Hyperlipidemia, unspecified: Secondary | ICD-10-CM | POA: Diagnosis present

## 2018-02-20 DIAGNOSIS — Z87891 Personal history of nicotine dependence: Secondary | ICD-10-CM | POA: Diagnosis not present

## 2018-02-20 DIAGNOSIS — R0602 Shortness of breath: Secondary | ICD-10-CM | POA: Diagnosis not present

## 2018-02-20 DIAGNOSIS — I361 Nonrheumatic tricuspid (valve) insufficiency: Secondary | ICD-10-CM | POA: Diagnosis not present

## 2018-02-20 DIAGNOSIS — R918 Other nonspecific abnormal finding of lung field: Secondary | ICD-10-CM | POA: Diagnosis not present

## 2018-02-20 DIAGNOSIS — R140 Abdominal distension (gaseous): Secondary | ICD-10-CM | POA: Diagnosis not present

## 2018-02-20 DIAGNOSIS — J9601 Acute respiratory failure with hypoxia: Secondary | ICD-10-CM | POA: Diagnosis not present

## 2018-02-20 DIAGNOSIS — R0902 Hypoxemia: Secondary | ICD-10-CM | POA: Diagnosis not present

## 2018-02-20 DIAGNOSIS — E870 Hyperosmolality and hypernatremia: Secondary | ICD-10-CM | POA: Diagnosis not present

## 2018-02-20 DIAGNOSIS — R5381 Other malaise: Secondary | ICD-10-CM | POA: Diagnosis present

## 2018-02-20 DIAGNOSIS — Z452 Encounter for adjustment and management of vascular access device: Secondary | ICD-10-CM | POA: Diagnosis not present

## 2018-02-20 DIAGNOSIS — E039 Hypothyroidism, unspecified: Secondary | ICD-10-CM | POA: Diagnosis present

## 2018-02-20 DIAGNOSIS — N179 Acute kidney failure, unspecified: Secondary | ICD-10-CM | POA: Diagnosis present

## 2018-02-20 DIAGNOSIS — R1310 Dysphagia, unspecified: Secondary | ICD-10-CM | POA: Diagnosis not present

## 2018-02-20 DIAGNOSIS — R4182 Altered mental status, unspecified: Secondary | ICD-10-CM | POA: Diagnosis not present

## 2018-02-20 DIAGNOSIS — D696 Thrombocytopenia, unspecified: Secondary | ICD-10-CM | POA: Diagnosis present

## 2018-02-20 DIAGNOSIS — E7800 Pure hypercholesterolemia, unspecified: Secondary | ICD-10-CM | POA: Diagnosis present

## 2018-02-20 DIAGNOSIS — A419 Sepsis, unspecified organism: Secondary | ICD-10-CM | POA: Diagnosis present

## 2018-02-22 DIAGNOSIS — I361 Nonrheumatic tricuspid (valve) insufficiency: Secondary | ICD-10-CM

## 2018-02-22 DIAGNOSIS — I340 Nonrheumatic mitral (valve) insufficiency: Secondary | ICD-10-CM

## 2018-03-06 ENCOUNTER — Inpatient Hospital Stay
Admission: RE | Admit: 2018-03-06 | Discharge: 2018-04-04 | Disposition: A | Discharge status: Hospice | Payer: Medicare Other | Attending: Internal Medicine | Admitting: Internal Medicine

## 2018-03-06 DIAGNOSIS — N183 Chronic kidney disease, stage 3 (moderate): Secondary | ICD-10-CM | POA: Diagnosis present

## 2018-03-06 DIAGNOSIS — R140 Abdominal distension (gaseous): Secondary | ICD-10-CM

## 2018-03-06 DIAGNOSIS — K7689 Other specified diseases of liver: Secondary | ICD-10-CM | POA: Diagnosis not present

## 2018-03-06 DIAGNOSIS — K639 Disease of intestine, unspecified: Secondary | ICD-10-CM

## 2018-03-06 DIAGNOSIS — E876 Hypokalemia: Secondary | ICD-10-CM | POA: Diagnosis not present

## 2018-03-06 DIAGNOSIS — Z7401 Bed confinement status: Secondary | ICD-10-CM | POA: Diagnosis not present

## 2018-03-06 DIAGNOSIS — J9600 Acute respiratory failure, unspecified whether with hypoxia or hypercapnia: Secondary | ICD-10-CM | POA: Diagnosis not present

## 2018-03-06 DIAGNOSIS — F0390 Unspecified dementia without behavioral disturbance: Secondary | ICD-10-CM | POA: Diagnosis present

## 2018-03-06 DIAGNOSIS — K567 Ileus, unspecified: Secondary | ICD-10-CM

## 2018-03-06 DIAGNOSIS — Z0189 Encounter for other specified special examinations: Secondary | ICD-10-CM

## 2018-03-06 DIAGNOSIS — J189 Pneumonia, unspecified organism: Secondary | ICD-10-CM | POA: Diagnosis not present

## 2018-03-06 DIAGNOSIS — R531 Weakness: Secondary | ICD-10-CM | POA: Diagnosis present

## 2018-03-06 DIAGNOSIS — Z6826 Body mass index (BMI) 26.0-26.9, adult: Secondary | ICD-10-CM | POA: Diagnosis not present

## 2018-03-06 DIAGNOSIS — R1310 Dysphagia, unspecified: Secondary | ICD-10-CM | POA: Diagnosis present

## 2018-03-06 DIAGNOSIS — N390 Urinary tract infection, site not specified: Secondary | ICD-10-CM | POA: Diagnosis not present

## 2018-03-06 DIAGNOSIS — N281 Cyst of kidney, acquired: Secondary | ICD-10-CM | POA: Diagnosis not present

## 2018-03-06 DIAGNOSIS — Z87891 Personal history of nicotine dependence: Secondary | ICD-10-CM | POA: Diagnosis not present

## 2018-03-06 DIAGNOSIS — E785 Hyperlipidemia, unspecified: Secondary | ICD-10-CM | POA: Diagnosis present

## 2018-03-06 DIAGNOSIS — J9621 Acute and chronic respiratory failure with hypoxia: Secondary | ICD-10-CM | POA: Diagnosis not present

## 2018-03-06 DIAGNOSIS — M2550 Pain in unspecified joint: Secondary | ICD-10-CM | POA: Diagnosis not present

## 2018-03-06 DIAGNOSIS — Z743 Need for continuous supervision: Secondary | ICD-10-CM | POA: Diagnosis not present

## 2018-03-06 DIAGNOSIS — R278 Other lack of coordination: Secondary | ICD-10-CM | POA: Diagnosis not present

## 2018-03-06 DIAGNOSIS — K5900 Constipation, unspecified: Secondary | ICD-10-CM

## 2018-03-06 DIAGNOSIS — J690 Pneumonitis due to inhalation of food and vomit: Secondary | ICD-10-CM | POA: Diagnosis present

## 2018-03-06 DIAGNOSIS — G9340 Encephalopathy, unspecified: Secondary | ICD-10-CM | POA: Diagnosis not present

## 2018-03-06 DIAGNOSIS — R6520 Severe sepsis without septic shock: Secondary | ICD-10-CM

## 2018-03-06 DIAGNOSIS — J9690 Respiratory failure, unspecified, unspecified whether with hypoxia or hypercapnia: Secondary | ICD-10-CM | POA: Diagnosis not present

## 2018-03-06 DIAGNOSIS — N189 Chronic kidney disease, unspecified: Secondary | ICD-10-CM | POA: Diagnosis not present

## 2018-03-06 DIAGNOSIS — K56609 Unspecified intestinal obstruction, unspecified as to partial versus complete obstruction: Secondary | ICD-10-CM | POA: Diagnosis not present

## 2018-03-06 DIAGNOSIS — Z66 Do not resuscitate: Secondary | ICD-10-CM | POA: Diagnosis present

## 2018-03-06 DIAGNOSIS — Z4682 Encounter for fitting and adjustment of non-vascular catheter: Secondary | ICD-10-CM | POA: Diagnosis not present

## 2018-03-06 DIAGNOSIS — J9601 Acute respiratory failure with hypoxia: Secondary | ICD-10-CM | POA: Diagnosis present

## 2018-03-06 DIAGNOSIS — F0280 Dementia in other diseases classified elsewhere without behavioral disturbance: Secondary | ICD-10-CM | POA: Diagnosis not present

## 2018-03-06 DIAGNOSIS — E46 Unspecified protein-calorie malnutrition: Secondary | ICD-10-CM | POA: Diagnosis present

## 2018-03-06 DIAGNOSIS — I129 Hypertensive chronic kidney disease with stage 1 through stage 4 chronic kidney disease, or unspecified chronic kidney disease: Secondary | ICD-10-CM | POA: Diagnosis present

## 2018-03-06 DIAGNOSIS — Z436 Encounter for attention to other artificial openings of urinary tract: Secondary | ICD-10-CM | POA: Diagnosis not present

## 2018-03-06 DIAGNOSIS — Z85038 Personal history of other malignant neoplasm of large intestine: Secondary | ICD-10-CM | POA: Diagnosis not present

## 2018-03-06 DIAGNOSIS — G301 Alzheimer's disease with late onset: Secondary | ICD-10-CM | POA: Diagnosis not present

## 2018-03-06 DIAGNOSIS — B964 Proteus (mirabilis) (morganii) as the cause of diseases classified elsewhere: Secondary | ICD-10-CM | POA: Diagnosis not present

## 2018-03-06 DIAGNOSIS — A419 Sepsis, unspecified organism: Secondary | ICD-10-CM | POA: Diagnosis not present

## 2018-03-06 DIAGNOSIS — R000 Tachycardia, unspecified: Secondary | ICD-10-CM | POA: Diagnosis not present

## 2018-03-06 DIAGNOSIS — Z4659 Encounter for fitting and adjustment of other gastrointestinal appliance and device: Secondary | ICD-10-CM

## 2018-03-06 DIAGNOSIS — N179 Acute kidney failure, unspecified: Secondary | ICD-10-CM | POA: Diagnosis present

## 2018-03-06 DIAGNOSIS — Z931 Gastrostomy status: Secondary | ICD-10-CM

## 2018-03-06 DIAGNOSIS — E039 Hypothyroidism, unspecified: Secondary | ICD-10-CM | POA: Diagnosis present

## 2018-03-06 DIAGNOSIS — K5649 Other impaction of intestine: Secondary | ICD-10-CM

## 2018-03-06 DIAGNOSIS — R739 Hyperglycemia, unspecified: Secondary | ICD-10-CM | POA: Diagnosis present

## 2018-03-06 HISTORY — DX: Unspecified dementia, unspecified severity, without behavioral disturbance, psychotic disturbance, mood disturbance, and anxiety: F03.90

## 2018-03-06 HISTORY — DX: Sepsis, unspecified organism: A41.9

## 2018-03-06 HISTORY — DX: Pneumonitis due to inhalation of food and vomit: J69.0

## 2018-03-06 HISTORY — DX: Severe sepsis without septic shock: R65.20

## 2018-03-06 HISTORY — DX: Acute and chronic respiratory failure with hypoxia: J96.21

## 2018-03-06 LAB — BLOOD GAS, ARTERIAL
Acid-Base Excess: 1.6 mmol/L (ref 0.0–2.0)
Bicarbonate: 24.5 mmol/L (ref 20.0–28.0)
O2 Content: 10 L/min
O2 Saturation: 92.3 %
Patient temperature: 98.6
pCO2 arterial: 31.3 mmHg — ABNORMAL LOW (ref 32.0–48.0)
pH, Arterial: 7.505 — ABNORMAL HIGH (ref 7.350–7.450)
pO2, Arterial: 62.8 mmHg — ABNORMAL LOW (ref 83.0–108.0)

## 2018-03-07 ENCOUNTER — Other Ambulatory Visit (HOSPITAL_COMMUNITY): Payer: Medicare Other

## 2018-03-07 DIAGNOSIS — J9690 Respiratory failure, unspecified, unspecified whether with hypoxia or hypercapnia: Secondary | ICD-10-CM | POA: Diagnosis not present

## 2018-03-07 LAB — CBC
HEMATOCRIT: 32 % — AB (ref 39.0–52.0)
Hemoglobin: 11.5 g/dL — ABNORMAL LOW (ref 13.0–17.0)
MCH: 30.8 pg (ref 26.0–34.0)
MCHC: 35.9 g/dL (ref 30.0–36.0)
MCV: 85.8 fL (ref 80.0–100.0)
Platelets: 218 10*3/uL (ref 150–400)
RBC: 3.73 MIL/uL — ABNORMAL LOW (ref 4.22–5.81)
RDW: 14.4 % (ref 11.5–15.5)
WBC: 6.6 10*3/uL (ref 4.0–10.5)
nRBC: 0 % (ref 0.0–0.2)

## 2018-03-07 LAB — COMPREHENSIVE METABOLIC PANEL WITH GFR
ALT: 34 U/L (ref 0–44)
AST: 47 U/L — ABNORMAL HIGH (ref 15–41)
Albumin: 2.8 g/dL — ABNORMAL LOW (ref 3.5–5.0)
Alkaline Phosphatase: 44 U/L (ref 38–126)
Anion gap: 11 (ref 5–15)
BUN: 9 mg/dL (ref 8–23)
CO2: 26 mmol/L (ref 22–32)
Calcium: 8.2 mg/dL — ABNORMAL LOW (ref 8.9–10.3)
Chloride: 106 mmol/L (ref 98–111)
Creatinine, Ser: 2.07 mg/dL — ABNORMAL HIGH (ref 0.61–1.24)
GFR calc Af Amer: 33 mL/min — ABNORMAL LOW
GFR calc non Af Amer: 28 mL/min — ABNORMAL LOW
Glucose, Bld: 98 mg/dL (ref 70–99)
Potassium: 4.2 mmol/L (ref 3.5–5.1)
Sodium: 143 mmol/L (ref 135–145)
Total Bilirubin: 1.6 mg/dL — ABNORMAL HIGH (ref 0.3–1.2)
Total Protein: 6.1 g/dL — ABNORMAL LOW (ref 6.5–8.1)

## 2018-03-07 LAB — T4, FREE: FREE T4: 0.67 ng/dL — AB (ref 0.82–1.77)

## 2018-03-07 LAB — PHOSPHORUS: Phosphorus: 2.8 mg/dL (ref 2.5–4.6)

## 2018-03-07 LAB — MAGNESIUM: Magnesium: 1.8 mg/dL (ref 1.7–2.4)

## 2018-03-07 LAB — TSH: TSH: 52.74 u[IU]/mL — ABNORMAL HIGH (ref 0.350–4.500)

## 2018-03-08 ENCOUNTER — Other Ambulatory Visit (HOSPITAL_COMMUNITY): Payer: Medicare Other

## 2018-03-08 DIAGNOSIS — Z4682 Encounter for fitting and adjustment of non-vascular catheter: Secondary | ICD-10-CM | POA: Diagnosis not present

## 2018-03-08 LAB — BASIC METABOLIC PANEL
Anion gap: 9 (ref 5–15)
BUN: 10 mg/dL (ref 8–23)
CALCIUM: 8 mg/dL — AB (ref 8.9–10.3)
CO2: 26 mmol/L (ref 22–32)
Chloride: 106 mmol/L (ref 98–111)
Creatinine, Ser: 2.03 mg/dL — ABNORMAL HIGH (ref 0.61–1.24)
GFR calc non Af Amer: 29 mL/min — ABNORMAL LOW (ref >60)
GFR, EST AFRICAN AMERICAN: 33 mL/min — AB (ref >60)
Glucose, Bld: 95 mg/dL (ref 70–99)
Potassium: 4 mmol/L (ref 3.5–5.1)
Sodium: 141 mmol/L (ref 135–145)

## 2018-03-08 NOTE — Consult Note (Signed)
[facility]  PULMONARY SERVICE  Date of Service: 03/08/2018  PULMONARY CRITICAL CARE CONSULT   Aaron Maynard  ZJQ:[id number]  DOB: Apr 15, 1929   DOA: 03/06/2018  Referring Physician: Merton Border, MD  HPI: Aaron Maynard is a 85 y.o. male seen for follow up of Acute on Chronic Respiratory Failure.  Patient has multiple medical problems transferred to our facility for further management..  The patient's past medical history significant for dementia hyperlipidemia cancer of the colon hypothyroidism he was originally admitted because of worsening of his confusion.  The patient apparently was falling asleep while sitting and eating dinner.  Patient was found on evaluation to have a temperature of 104 and chest x-ray was showing pneumonia.  He also had an elevated lactate and was felt to be septic.  Patient deteriorated ended up having to be intubated and was placed on mechanical ventilation.  He was intubated and apparently subsequently was extubated.  Patient did undergo evaluation by speech therapy was found to be a high risk for aspiration.  He is transferred now to our facility for ongoing oxygen requirements.  Review of Systems:  ROS performed and is unremarkable other than noted above.  Past medical history: Hypertension Hyperlipidemia Hypothyroidism Chronic kidney disease Dementia Colon cancer  Past surgical history: Cholecystectomy Barium swallow  Family history: Noncontributory to the present illness.  Social history: Former smoker No alcohol abuse No IV drug use or other recreational drugs.  Allergies: No known drug allergies  Medications: Reviewed on Rounds  Physical Exam:  Vitals: Temperature 97.2 pulse 75 respiratory rate 20 blood pressure is 151/80 saturations 98%  Ventilator Settings currently is off the ventilator on high flow nasal cannula 20 L flow rate with 50% FiO2  . General: Comfortable at this  time . Eyes: Grossly normal lids, irises & conjunctiva . ENT: grossly tongue is normal . Neck: no obvious mass . Cardiovascular: S1-S2 normal no gallop or rub . Respiratory: Scattered rhonchi expansion equal . Abdomen: Soft and nontender . Skin: no rash seen on limited exam . Musculoskeletal: not rigid . Psychiatric:unable to assess . Neurologic: no seizure no involuntary movements         Labs on Admission:  Basic Metabolic Panel: Recent Labs  Lab 03/07/18 0937 03/08/18 0530  NA 143 141  K 4.2 4.0  CL 106 106  CO2 26 26  GLUCOSE 98 95  BUN 9 10  CREATININE 2.07* 2.03*  CALCIUM 8.2* 8.0*  MG 1.8  --   PHOS 2.8  --     Recent Labs  Lab 03/06/18 2050  PHART 7.505*  PCO2ART 31.3*  PO2ART 62.8*  HCO3 24.5  O2SAT 92.3    Liver Function Tests: Recent Labs  Lab 03/07/18 0937  AST 47*  ALT 34  ALKPHOS 44  BILITOT 1.6*  PROT 6.1*  ALBUMIN 2.8*   No results for input(s): LIPASE, AMYLASE in the last 168 hours. No results for input(s): AMMONIA in the last 168 hours.  CBC: Recent Labs  Lab 03/07/18 0937  WBC 6.6  HGB 11.5*  HCT 32.0*  MCV 85.8  PLT 218    Cardiac Enzymes: No results for input(s): CKTOTAL, CKMB, CKMBINDEX, TROPONINI in the last 168 hours.  BNP (last 3 results) No results for input(s): BNP in the last 8760 hours.  ProBNP (last 3 results) No results for input(s): PROBNP in the last 8760 hours.   Radiological Exams on Admission: Dg Chest Port 1 View  Result Date: 03/07/2018 CLINICAL  DATA:  Respiratory failure EXAM: PORTABLE CHEST 1 VIEW COMPARISON:  03/05/2018 FINDINGS: Low lung volumes. Bibasilar atelectasis. Vascular congestion. No visible effusions or acute bony abnormality. IMPRESSION: Low lung volumes with vascular congestion and bibasilar atelectasis. Electronically Signed   By: Rolm Baptise M.D.   On: 03/07/2018 09:51    Assessment/Plan Active Problems:   Acute on chronic respiratory failure with hypoxia (HCC)   Severe sepsis  (HCC)   Aspiration pneumonia due to food (regurgitated) ([place])   Dementia without behavioral disturbance ([place])   1. Acute on chronic respiratory failure with hypoxia patient at this time will be continued on the heated high flow will titrate down as tolerated continue pulmonary toilet supportive care. 2. Sepsis patient has improved hemodynamics right now appears to be resolved we will continue to monitor. 3. Pneumonia due to aspiration has been treated with antibiotics we will continue to follow radiologically.  Last chest x-ray was showing low lung volumes. 4. Dementia patient is at baseline continue to monitor  I have personally seen and evaluated the patient, evaluated laboratory and imaging results, formulated the assessment and plan and placed orders. The Patient requires high complexity decision making for assessment and support.  Case was discussed on Rounds with the Respiratory Therapy Staff Time Spent 45minutes  Allyne Gee, MD [facility] Pulmonary Critical Care Medicine Sleep Medicine

## 2018-03-09 ENCOUNTER — Encounter: Payer: Self-pay | Admitting: Internal Medicine

## 2018-03-09 DIAGNOSIS — R6520 Severe sepsis without septic shock: Secondary | ICD-10-CM | POA: Diagnosis present

## 2018-03-09 DIAGNOSIS — F0390 Unspecified dementia without behavioral disturbance: Secondary | ICD-10-CM | POA: Diagnosis present

## 2018-03-09 DIAGNOSIS — A419 Sepsis, unspecified organism: Secondary | ICD-10-CM | POA: Diagnosis present

## 2018-03-09 DIAGNOSIS — J690 Pneumonitis due to inhalation of food and vomit: Secondary | ICD-10-CM | POA: Diagnosis present

## 2018-03-09 DIAGNOSIS — J9621 Acute and chronic respiratory failure with hypoxia: Secondary | ICD-10-CM

## 2018-03-09 NOTE — Progress Notes (Signed)
Pulmonary Critical Care Medicine [place]   PULMONARY CRITICAL CARE SERVICE  PROGRESS NOTE  Date of Service: 03/09/2018  Aaron Maynard  GOT:[id number]  DOB: 10-06-1929   DOA: 03/06/2018  Referring Physician: Merton Border, MD  HPI: Natalio Salois is a 85 y.o. male seen for follow up of Acute on Chronic Respiratory Failure.  Patient is doing about the same he remains on 50% oxygen has been on 30 L flow rate now.  Still remains pleasantly confused  Medications: Reviewed on Rounds  Physical Exam:  Vitals: Temperature 98.8 pulse 97 respiratory 18 blood pressure 146/85 saturations 97%  Ventilator Settings currently is off the ventilator on high flow nasal cannula  . General: Comfortable at this time . Eyes: Grossly normal lids, irises & conjunctiva . ENT: grossly tongue is normal . Neck: no obvious mass . Cardiovascular: S1 S2 normal no gallop . Respiratory: No rhonchi or rales are noted at this time . Abdomen: soft . Skin: no rash seen on limited exam . Musculoskeletal: not rigid . Psychiatric:unable to assess . Neurologic: no seizure no involuntary movements         Lab Data:   Basic Metabolic Panel: Recent Labs  Lab 03/07/18 0937 03/08/18 0530  NA 143 141  K 4.2 4.0  CL 106 106  CO2 26 26  GLUCOSE 98 95  BUN 9 10  CREATININE 2.07* 2.03*  CALCIUM 8.2* 8.0*  MG 1.8  --   PHOS 2.8  --     ABG: Recent Labs  Lab 03/06/18 2050  PHART 7.505*  PCO2ART 31.3*  PO2ART 62.8*  HCO3 24.5  O2SAT 92.3    Liver Function Tests: Recent Labs  Lab 03/07/18 0937  AST 47*  ALT 34  ALKPHOS 44  BILITOT 1.6*  PROT 6.1*  ALBUMIN 2.8*   No results for input(s): LIPASE, AMYLASE in the last 168 hours. No results for input(s): AMMONIA in the last 168 hours.  CBC: Recent Labs  Lab 03/07/18 0937  WBC 6.6  HGB 11.5*  HCT 32.0*  MCV 85.8  PLT 218    Cardiac Enzymes: No results for input(s): CKTOTAL, CKMB, CKMBINDEX, TROPONINI in the  last 168 hours.  BNP (last 3 results) No results for input(s): BNP in the last 8760 hours.  ProBNP (last 3 results) No results for input(s): PROBNP in the last 8760 hours.  Radiological Exams: Dg Abd Portable 1v  Result Date: 03/08/2018 CLINICAL DATA:  85 year old male enteric tube placement. EXAM: PORTABLE ABDOMEN - 1 VIEW COMPARISON:  02/23/2018. FINDINGS: Portable AP upright view at 1603 hours. Enteric feeding tube with radiopaque/weighted tip courses to the left upper quadrant and terminates in the gastric body, tip directed toward the fundus. Previously seen in G type 2 no longer present. Barium type contrast is present in some of the large bowel. Stable lung bases. IMPRESSION: Enteric feeding tube located at the level of the gastric body. If post pyloric placement is desired the tube should be advanced (5-10 centimeters or so) to allow enough slack for distal transit. Electronically Signed   By: Genevie Ann M.D.   On: 03/08/2018 16:13    Assessment/Plan Active Problems:   Acute on chronic respiratory failure with hypoxia (HCC)   Severe sepsis (HCC)   Aspiration pneumonia due to food (regurgitated) ([place])   Dementia without behavioral disturbance ([place])   1. Acute on chronic respiratory failure with hypoxia we will continue with the oxygen therapy try to wean down as tolerated 2. Severe sepsis resolved hemodynamically  stable 3. Aspiration pneumonia continue to monitor radiologically and also monitor his swallowing. 4. Dementia right now is not confused we will continue with supportive care   I have personally seen and evaluated the patient, evaluated laboratory and imaging results, formulated the assessment and plan and placed orders. The Patient requires high complexity decision making for assessment and support.  Case was discussed on Rounds with the Respiratory Therapy Staff  Allyne Gee, MD [facility] Pulmonary Critical Care Medicine Sleep Medicine

## 2018-03-10 ENCOUNTER — Other Ambulatory Visit (HOSPITAL_COMMUNITY): Payer: Medicare Other

## 2018-03-10 DIAGNOSIS — Z4682 Encounter for fitting and adjustment of non-vascular catheter: Secondary | ICD-10-CM | POA: Diagnosis not present

## 2018-03-10 DIAGNOSIS — R140 Abdominal distension (gaseous): Secondary | ICD-10-CM | POA: Diagnosis not present

## 2018-03-10 NOTE — Progress Notes (Signed)
Pulmonary Critical Care Medicine [place]   PULMONARY CRITICAL CARE SERVICE  PROGRESS NOTE  Date of Service: 03/10/2018  Berman Grainger  KXF:[id number]  DOB: October 12, 1929   DOA: 03/06/2018  Referring Physician: Merton Border, MD  HPI: Aaron Maynard is a 85 y.o. male seen for follow up of Acute on Chronic Respiratory Failure.  Patient currently is comfortable without distress off the ventilator on nasal cannula  Medications: Reviewed on Rounds  Physical Exam:  Vitals: Temperature 98.1 pulse 72 respiratory rate 20 blood pressure 156/62 saturations 100%  Ventilator Settings off the ventilator on nasal cannula  . General: Comfortable at this time . Eyes: Grossly normal lids, irises & conjunctiva . ENT: grossly tongue is normal . Neck: no obvious mass . Cardiovascular: S1 S2 normal no gallop . Respiratory: No rhonchi or rales are noted . Abdomen: soft . Skin: no rash seen on limited exam . Musculoskeletal: not rigid . Psychiatric:unable to assess . Neurologic: no seizure no involuntary movements         Lab Data:   Basic Metabolic Panel: Recent Labs  Lab 03/07/18 0937 03/08/18 0530  NA 143 141  K 4.2 4.0  CL 106 106  CO2 26 26  GLUCOSE 98 95  BUN 9 10  CREATININE 2.07* 2.03*  CALCIUM 8.2* 8.0*  MG 1.8  --   PHOS 2.8  --     ABG: Recent Labs  Lab 03/06/18 2050  PHART 7.505*  PCO2ART 31.3*  PO2ART 62.8*  HCO3 24.5  O2SAT 92.3    Liver Function Tests: Recent Labs  Lab 03/07/18 0937  AST 47*  ALT 34  ALKPHOS 44  BILITOT 1.6*  PROT 6.1*  ALBUMIN 2.8*   No results for input(s): LIPASE, AMYLASE in the last 168 hours. No results for input(s): AMMONIA in the last 168 hours.  CBC: Recent Labs  Lab 03/07/18 0937  WBC 6.6  HGB 11.5*  HCT 32.0*  MCV 85.8  PLT 218    Cardiac Enzymes: No results for input(s): CKTOTAL, CKMB, CKMBINDEX, TROPONINI in the last 168 hours.  BNP (last 3 results) No results for input(s): BNP  in the last 8760 hours.  ProBNP (last 3 results) No results for input(s): PROBNP in the last 8760 hours.  Radiological Exams: Dg Abd Portable 1v  Result Date: 03/10/2018 CLINICAL DATA:  85 year old with abdominal distension. EXAM: PORTABLE ABDOMEN - 1 VIEW COMPARISON:  03/08/2018 feed FINDINGS: Feeding tube in the left upper abdomen, probably in the gastric fundus region and stable. Again noted is a large amount of gas and barium in the colon. There is. In the rectum. Surgical clips throughout the pelvis. Gas-filled loops of small bowel. Limited evaluation for free air on these supine images. Limited evaluation of the lung bases. IMPRESSION: 1. Stable appearance of the feeding tube in the gastric fundus region. 2. Large amount of gas in the colon containing barium. In addition, there are gas-filled loops of small bowel. Bowel pattern may represent an ileus pattern. Electronically Signed   By: Markus Daft M.D.   On: 03/10/2018 12:38   Dg Abd Portable 1v  Result Date: 03/08/2018 CLINICAL DATA:  85 year old male enteric tube placement. EXAM: PORTABLE ABDOMEN - 1 VIEW COMPARISON:  02/23/2018. FINDINGS: Portable AP upright view at 1603 hours. Enteric feeding tube with radiopaque/weighted tip courses to the left upper quadrant and terminates in the gastric body, tip directed toward the fundus. Previously seen in G type 2 no longer present. Barium type contrast is present in  some of the large bowel. Stable lung bases. IMPRESSION: Enteric feeding tube located at the level of the gastric body. If post pyloric placement is desired the tube should be advanced (5-10 centimeters or so) to allow enough slack for distal transit. Electronically Signed   By: Genevie Ann M.D.   On: 03/08/2018 16:13    Assessment/Plan Active Problems:   Acute on chronic respiratory failure with hypoxia (HCC)   Severe sepsis (HCC)   Aspiration pneumonia due to food (regurgitated) ([place])   Dementia without behavioral disturbance  ([place])   1. Acute on chronic respiratory failure with hypoxia continue with nasal cannula as tolerated titrate oxygen continue pulmonary toilet 2. Severe sepsis hemodynamically stable 3. Aspiration pneumonia treated 4. Dementia at baseline remains pleasantly confused   I have personally seen and evaluated the patient, evaluated laboratory and imaging results, formulated the assessment and plan and placed orders. The Patient requires high complexity decision making for assessment and support.  Case was discussed on Rounds with the Respiratory Therapy Staff  Allyne Gee, MD [facility] Pulmonary Critical Care Medicine Sleep Medicine

## 2018-03-11 LAB — BASIC METABOLIC PANEL
Anion gap: 10 (ref 5–15)
BUN: 9 mg/dL (ref 8–23)
CHLORIDE: 105 mmol/L (ref 98–111)
CO2: 24 mmol/L (ref 22–32)
Calcium: 8 mg/dL — ABNORMAL LOW (ref 8.9–10.3)
Creatinine, Ser: 1.92 mg/dL — ABNORMAL HIGH (ref 0.61–1.24)
GFR calc Af Amer: 36 mL/min — ABNORMAL LOW (ref >60)
GFR calc non Af Amer: 31 mL/min — ABNORMAL LOW (ref >60)
Glucose, Bld: 113 mg/dL — ABNORMAL HIGH (ref 70–99)
Potassium: 3.7 mmol/L (ref 3.5–5.1)
Sodium: 139 mmol/L (ref 135–145)

## 2018-03-11 LAB — CBC
HCT: 32.8 % — ABNORMAL LOW (ref 39.0–52.0)
HEMOGLOBIN: 11.6 g/dL — AB (ref 13.0–17.0)
MCH: 30.4 pg (ref 26.0–34.0)
MCHC: 35.4 g/dL (ref 30.0–36.0)
MCV: 85.9 fL (ref 80.0–100.0)
Platelets: 263 10*3/uL (ref 150–400)
RBC: 3.82 MIL/uL — ABNORMAL LOW (ref 4.22–5.81)
RDW: 14.6 % (ref 11.5–15.5)
WBC: 6 10*3/uL (ref 4.0–10.5)
nRBC: 0 % (ref 0.0–0.2)

## 2018-03-11 NOTE — Progress Notes (Signed)
Pulmonary Critical Care Medicine [place]   PULMONARY CRITICAL CARE SERVICE  PROGRESS NOTE  Date of Service: 03/11/2018  Aaron Maynard  WFU:[id number]  DOB: December 09, 1929   DOA: 03/06/2018  Referring Physician: Merton Border, MD  HPI: Aaron Maynard is a 85 y.o. male seen for follow up of Acute on Chronic Respiratory Failure.  Patient remains pleasantly confused.  He is on nasal cannula without distress  Medications: Reviewed on Rounds  Physical Exam:  Vitals: Temperature 98.0 pulse 79 respiratory 18 blood pressure 165/80 saturations 100%  Ventilator Settings patient right now is off the ventilator on nasal cannula  . General: Comfortable at this time . Eyes: Grossly normal lids, irises & conjunctiva . ENT: grossly tongue is normal . Neck: no obvious mass . Cardiovascular: S1 S2 normal no gallop . Respiratory: Scattered rhonchi expansion is equal . Abdomen: soft . Skin: no rash seen on limited exam . Musculoskeletal: not rigid . Psychiatric:unable to assess . Neurologic: no seizure no involuntary movements         Lab Data:   Basic Metabolic Panel: Recent Labs  Lab 03/07/18 0937 03/08/18 0530 03/11/18 0616  NA 143 141 139  K 4.2 4.0 3.7  CL 106 106 105  CO2 26 26 24   GLUCOSE 98 95 113*  BUN 9 10 9   CREATININE 2.07* 2.03* 1.92*  CALCIUM 8.2* 8.0* 8.0*  MG 1.8  --   --   PHOS 2.8  --   --     ABG: Recent Labs  Lab 03/06/18 2050  PHART 7.505*  PCO2ART 31.3*  PO2ART 62.8*  HCO3 24.5  O2SAT 92.3    Liver Function Tests: Recent Labs  Lab 03/07/18 0937  AST 47*  ALT 34  ALKPHOS 44  BILITOT 1.6*  PROT 6.1*  ALBUMIN 2.8*   No results for input(s): LIPASE, AMYLASE in the last 168 hours. No results for input(s): AMMONIA in the last 168 hours.  CBC: Recent Labs  Lab 03/07/18 0937 03/11/18 0616  WBC 6.6 6.0  HGB 11.5* 11.6*  HCT 32.0* 32.8*  MCV 85.8 85.9  PLT 218 263    Cardiac Enzymes: No results for input(s):  CKTOTAL, CKMB, CKMBINDEX, TROPONINI in the last 168 hours.  BNP (last 3 results) No results for input(s): BNP in the last 8760 hours.  ProBNP (last 3 results) No results for input(s): PROBNP in the last 8760 hours.  Radiological Exams: Dg Abd Portable 1v  Result Date: 03/10/2018 CLINICAL DATA:  Evaluate NG tube EXAM: PORTABLE ABDOMEN - 1 VIEW COMPARISON:  March 10, 2018 FINDINGS: The NG tube terminates in the left upper quadrant, within the stomach. Contrast seen throughout the colon. IMPRESSION: The NG tube terminates in the stomach. Electronically Signed   By: Dorise Bullion III M.D   On: 03/10/2018 15:37   Dg Abd Portable 1v  Result Date: 03/10/2018 CLINICAL DATA:  85 year old with abdominal distension. EXAM: PORTABLE ABDOMEN - 1 VIEW COMPARISON:  03/08/2018 feed FINDINGS: Feeding tube in the left upper abdomen, probably in the gastric fundus region and stable. Again noted is a large amount of gas and barium in the colon. There is. In the rectum. Surgical clips throughout the pelvis. Gas-filled loops of small bowel. Limited evaluation for free air on these supine images. Limited evaluation of the lung bases. IMPRESSION: 1. Stable appearance of the feeding tube in the gastric fundus region. 2. Large amount of gas in the colon containing barium. In addition, there are gas-filled loops of small bowel. Bowel  pattern may represent an ileus pattern. Electronically Signed   By: Markus Daft M.D.   On: 03/10/2018 12:38    Assessment/Plan Active Problems:   Acute on chronic respiratory failure with hypoxia (HCC)   Severe sepsis (HCC)   Aspiration pneumonia due to food (regurgitated) ([place])   Dementia without behavioral disturbance ([place])   1. Acute on chronic respiratory failure with hypoxia we will continue with oxygen therapy titrate as tolerated continue pulmonary toilet supportive care. 2. Severe sepsis hemodynamically stable at this time we will continue present management 3. Aspiration  pneumonia treated follow-up x-rays. 4. Dementia unchanged we will continue supportive care   I have personally seen and evaluated the patient, evaluated laboratory and imaging results, formulated the assessment and plan and placed orders. The Patient requires high complexity decision making for assessment and support.  Case was discussed on Rounds with the Respiratory Therapy Staff  Allyne Gee, MD [facility] Pulmonary Critical Care Medicine Sleep Medicine

## 2018-03-12 ENCOUNTER — Other Ambulatory Visit (HOSPITAL_COMMUNITY): Payer: Medicare Other

## 2018-03-12 DIAGNOSIS — K567 Ileus, unspecified: Secondary | ICD-10-CM | POA: Diagnosis not present

## 2018-03-13 ENCOUNTER — Other Ambulatory Visit (HOSPITAL_COMMUNITY): Payer: Medicare Other

## 2018-03-13 DIAGNOSIS — K567 Ileus, unspecified: Secondary | ICD-10-CM | POA: Diagnosis not present

## 2018-03-13 LAB — RENAL FUNCTION PANEL
Albumin: 2.5 g/dL — ABNORMAL LOW (ref 3.5–5.0)
Anion gap: 8 (ref 5–15)
BUN: 14 mg/dL (ref 8–23)
CO2: 26 mmol/L (ref 22–32)
Calcium: 8.1 mg/dL — ABNORMAL LOW (ref 8.9–10.3)
Chloride: 106 mmol/L (ref 98–111)
Creatinine, Ser: 1.7 mg/dL — ABNORMAL HIGH (ref 0.61–1.24)
GFR calc non Af Amer: 36 mL/min — ABNORMAL LOW (ref >60)
GFR, EST AFRICAN AMERICAN: 41 mL/min — AB (ref >60)
Glucose, Bld: 101 mg/dL — ABNORMAL HIGH (ref 70–99)
PHOSPHORUS: 3.1 mg/dL (ref 2.5–4.6)
Potassium: 2.9 mmol/L — ABNORMAL LOW (ref 3.5–5.1)
Sodium: 140 mmol/L (ref 135–145)

## 2018-03-13 LAB — CBC
HCT: 31.1 % — ABNORMAL LOW (ref 39.0–52.0)
Hemoglobin: 10.8 g/dL — ABNORMAL LOW (ref 13.0–17.0)
MCH: 29.7 pg (ref 26.0–34.0)
MCHC: 34.7 g/dL (ref 30.0–36.0)
MCV: 85.4 fL (ref 80.0–100.0)
Platelets: 238 10*3/uL (ref 150–400)
RBC: 3.64 MIL/uL — AB (ref 4.22–5.81)
RDW: 14.4 % (ref 11.5–15.5)
WBC: 6.2 10*3/uL (ref 4.0–10.5)
nRBC: 0 % (ref 0.0–0.2)

## 2018-03-13 LAB — MAGNESIUM: Magnesium: 1.8 mg/dL (ref 1.7–2.4)

## 2018-03-14 ENCOUNTER — Other Ambulatory Visit (HOSPITAL_COMMUNITY): Payer: Medicare Other

## 2018-03-14 DIAGNOSIS — K567 Ileus, unspecified: Secondary | ICD-10-CM | POA: Diagnosis not present

## 2018-03-14 LAB — MAGNESIUM: Magnesium: 1.7 mg/dL (ref 1.7–2.4)

## 2018-03-14 LAB — POTASSIUM: Potassium: 3.1 mmol/L — ABNORMAL LOW (ref 3.5–5.1)

## 2018-03-15 LAB — POTASSIUM: Potassium: 3.2 mmol/L — ABNORMAL LOW (ref 3.5–5.1)

## 2018-03-15 LAB — MAGNESIUM: Magnesium: 2.1 mg/dL (ref 1.7–2.4)

## 2018-03-16 ENCOUNTER — Other Ambulatory Visit (HOSPITAL_COMMUNITY): Payer: Medicare Other

## 2018-03-16 DIAGNOSIS — Z4682 Encounter for fitting and adjustment of non-vascular catheter: Secondary | ICD-10-CM | POA: Diagnosis not present

## 2018-03-16 LAB — URINALYSIS, ROUTINE W REFLEX MICROSCOPIC
Bacteria, UA: NONE SEEN
Bilirubin Urine: NEGATIVE
Glucose, UA: NEGATIVE mg/dL
Ketones, ur: NEGATIVE mg/dL
Leukocytes, UA: NEGATIVE
Nitrite: NEGATIVE
Protein, ur: NEGATIVE mg/dL
Specific Gravity, Urine: 1.015 (ref 1.005–1.030)
pH: 7 (ref 5.0–8.0)

## 2018-03-16 LAB — BASIC METABOLIC PANEL
Anion gap: 10 (ref 5–15)
BUN: 22 mg/dL (ref 8–23)
CO2: 26 mmol/L (ref 22–32)
CREATININE: 1.54 mg/dL — AB (ref 0.61–1.24)
Calcium: 8.2 mg/dL — ABNORMAL LOW (ref 8.9–10.3)
Chloride: 105 mmol/L (ref 98–111)
GFR calc Af Amer: 47 mL/min — ABNORMAL LOW (ref >60)
GFR calc non Af Amer: 40 mL/min — ABNORMAL LOW (ref >60)
Glucose, Bld: 117 mg/dL — ABNORMAL HIGH (ref 70–99)
Potassium: 3.5 mmol/L (ref 3.5–5.1)
Sodium: 141 mmol/L (ref 135–145)

## 2018-03-17 ENCOUNTER — Other Ambulatory Visit (HOSPITAL_COMMUNITY): Payer: Medicare Other

## 2018-03-17 LAB — BASIC METABOLIC PANEL
Anion gap: 8 (ref 5–15)
BUN: 20 mg/dL (ref 8–23)
CO2: 26 mmol/L (ref 22–32)
Calcium: 8 mg/dL — ABNORMAL LOW (ref 8.9–10.3)
Chloride: 106 mmol/L (ref 98–111)
Creatinine, Ser: 1.67 mg/dL — ABNORMAL HIGH (ref 0.61–1.24)
GFR calc non Af Amer: 36 mL/min — ABNORMAL LOW (ref >60)
GFR, EST AFRICAN AMERICAN: 42 mL/min — AB (ref >60)
Glucose, Bld: 108 mg/dL — ABNORMAL HIGH (ref 70–99)
Potassium: 3.2 mmol/L — ABNORMAL LOW (ref 3.5–5.1)
SODIUM: 140 mmol/L (ref 135–145)

## 2018-03-17 LAB — URINE CULTURE: Culture: <10000 — AB

## 2018-03-17 LAB — MAGNESIUM: MAGNESIUM: 2 mg/dL (ref 1.7–2.4)

## 2018-03-18 ENCOUNTER — Other Ambulatory Visit (HOSPITAL_COMMUNITY): Payer: Medicare Other

## 2018-03-18 DIAGNOSIS — K567 Ileus, unspecified: Secondary | ICD-10-CM | POA: Diagnosis not present

## 2018-03-18 LAB — BASIC METABOLIC PANEL
Anion gap: 11 (ref 5–15)
BUN: 15 mg/dL (ref 8–23)
CO2: 24 mmol/L (ref 22–32)
CREATININE: 1.68 mg/dL — AB (ref 0.61–1.24)
Calcium: 7.8 mg/dL — ABNORMAL LOW (ref 8.9–10.3)
Chloride: 103 mmol/L (ref 98–111)
GFR calc Af Amer: 42 mL/min — ABNORMAL LOW (ref >60)
GFR calc non Af Amer: 36 mL/min — ABNORMAL LOW (ref >60)
Glucose, Bld: 107 mg/dL — ABNORMAL HIGH (ref 70–99)
Potassium: 3.6 mmol/L (ref 3.5–5.1)
Sodium: 138 mmol/L (ref 135–145)

## 2018-03-18 LAB — CBC
HCT: 29.6 % — ABNORMAL LOW (ref 39.0–52.0)
Hemoglobin: 10.4 g/dL — ABNORMAL LOW (ref 13.0–17.0)
MCH: 30.4 pg (ref 26.0–34.0)
MCHC: 35.1 g/dL (ref 30.0–36.0)
MCV: 86.5 fL (ref 80.0–100.0)
Platelets: 246 10*3/uL (ref 150–400)
RBC: 3.42 MIL/uL — ABNORMAL LOW (ref 4.22–5.81)
RDW: 14.3 % (ref 11.5–15.5)
WBC: 6.2 10*3/uL (ref 4.0–10.5)
nRBC: 0 % (ref 0.0–0.2)

## 2018-03-20 ENCOUNTER — Other Ambulatory Visit (HOSPITAL_COMMUNITY): Payer: Medicare Other

## 2018-03-20 DIAGNOSIS — N281 Cyst of kidney, acquired: Secondary | ICD-10-CM | POA: Diagnosis not present

## 2018-03-20 LAB — BASIC METABOLIC PANEL
Anion gap: 9 (ref 5–15)
BUN: 18 mg/dL (ref 8–23)
CO2: 22 mmol/L (ref 22–32)
Calcium: 8.2 mg/dL — ABNORMAL LOW (ref 8.9–10.3)
Chloride: 107 mmol/L (ref 98–111)
Creatinine, Ser: 1.45 mg/dL — ABNORMAL HIGH (ref 0.61–1.24)
GFR calc Af Amer: 50 mL/min — ABNORMAL LOW (ref >60)
GFR calc non Af Amer: 43 mL/min — ABNORMAL LOW (ref >60)
Glucose, Bld: 114 mg/dL — ABNORMAL HIGH (ref 70–99)
Potassium: 3.1 mmol/L — ABNORMAL LOW (ref 3.5–5.1)
Sodium: 138 mmol/L (ref 135–145)

## 2018-03-21 LAB — POTASSIUM: Potassium: 3.5 mmol/L (ref 3.5–5.1)

## 2018-03-22 LAB — BASIC METABOLIC PANEL
Anion gap: 9 (ref 5–15)
BUN: 14 mg/dL (ref 8–23)
CO2: 26 mmol/L (ref 22–32)
Calcium: 8.2 mg/dL — ABNORMAL LOW (ref 8.9–10.3)
Chloride: 105 mmol/L (ref 98–111)
Creatinine, Ser: 1.44 mg/dL — ABNORMAL HIGH (ref 0.61–1.24)
GFR calc Af Amer: 51 mL/min — ABNORMAL LOW (ref >60)
GFR calc non Af Amer: 44 mL/min — ABNORMAL LOW (ref >60)
Glucose, Bld: 104 mg/dL — ABNORMAL HIGH (ref 70–99)
Potassium: 3.4 mmol/L — ABNORMAL LOW (ref 3.5–5.1)
Sodium: 140 mmol/L (ref 135–145)

## 2018-03-22 LAB — CBC
HCT: 31.7 % — ABNORMAL LOW (ref 39.0–52.0)
Hemoglobin: 11.1 g/dL — ABNORMAL LOW (ref 13.0–17.0)
MCH: 29.9 pg (ref 26.0–34.0)
MCHC: 35 g/dL (ref 30.0–36.0)
MCV: 85.4 fL (ref 80.0–100.0)
Platelets: 250 10*3/uL (ref 150–400)
RBC: 3.71 MIL/uL — ABNORMAL LOW (ref 4.22–5.81)
RDW: 14.1 % (ref 11.5–15.5)
WBC: 5.7 10*3/uL (ref 4.0–10.5)
nRBC: 0 % (ref 0.0–0.2)

## 2018-03-22 LAB — MAGNESIUM: Magnesium: 1.8 mg/dL (ref 1.7–2.4)

## 2018-03-22 NOTE — Progress Notes (Signed)
IR aware of request for g-tube placement - CT abdomen reviewed by Dr. Kathlene Cote on 1/30. Patient noted to have significant colonic and small bowel ileus without anterior percutaneous window for safe gastric puncture.  This was discussed with Priya, NP who states understanding and will reorder gastrostomy tube placement for repeat evaluation once ileus has been resolved.  Current order for g-tube placement as been cancelled. Please reorder when ileus has resolved.  Please call IR with questions or concerns.  Candiss Norse, PA-C

## 2018-03-23 ENCOUNTER — Other Ambulatory Visit (HOSPITAL_COMMUNITY): Payer: Medicare Other

## 2018-03-23 DIAGNOSIS — K567 Ileus, unspecified: Secondary | ICD-10-CM | POA: Diagnosis not present

## 2018-03-23 LAB — POTASSIUM: Potassium: 3.8 mmol/L (ref 3.5–5.1)

## 2018-03-24 LAB — RENAL FUNCTION PANEL
ANION GAP: 9 (ref 5–15)
Albumin: 2.6 g/dL — ABNORMAL LOW (ref 3.5–5.0)
BUN: 12 mg/dL (ref 8–23)
CO2: 24 mmol/L (ref 22–32)
Calcium: 8.3 mg/dL — ABNORMAL LOW (ref 8.9–10.3)
Chloride: 106 mmol/L (ref 98–111)
Creatinine, Ser: 1.67 mg/dL — ABNORMAL HIGH (ref 0.61–1.24)
GFR calc Af Amer: 42 mL/min — ABNORMAL LOW (ref >60)
GFR calc non Af Amer: 36 mL/min — ABNORMAL LOW (ref >60)
Glucose, Bld: 109 mg/dL — ABNORMAL HIGH (ref 70–99)
Phosphorus: 2.9 mg/dL (ref 2.5–4.6)
Potassium: 3.9 mmol/L (ref 3.5–5.1)
Sodium: 139 mmol/L (ref 135–145)

## 2018-03-24 LAB — CBC
HCT: 31.8 % — ABNORMAL LOW (ref 39.0–52.0)
Hemoglobin: 11.3 g/dL — ABNORMAL LOW (ref 13.0–17.0)
MCH: 30.7 pg (ref 26.0–34.0)
MCHC: 35.5 g/dL (ref 30.0–36.0)
MCV: 86.4 fL (ref 80.0–100.0)
Platelets: 240 10*3/uL (ref 150–400)
RBC: 3.68 MIL/uL — ABNORMAL LOW (ref 4.22–5.81)
RDW: 14.3 % (ref 11.5–15.5)
WBC: 5.3 10*3/uL (ref 4.0–10.5)
nRBC: 0 % (ref 0.0–0.2)

## 2018-03-24 LAB — MAGNESIUM: Magnesium: 1.8 mg/dL (ref 1.7–2.4)

## 2018-03-25 ENCOUNTER — Other Ambulatory Visit (HOSPITAL_COMMUNITY): Payer: Medicare Other

## 2018-03-25 DIAGNOSIS — K567 Ileus, unspecified: Secondary | ICD-10-CM | POA: Diagnosis not present

## 2018-03-26 ENCOUNTER — Other Ambulatory Visit (HOSPITAL_COMMUNITY): Payer: Medicare Other

## 2018-03-27 ENCOUNTER — Other Ambulatory Visit (HOSPITAL_COMMUNITY): Payer: Medicare Other

## 2018-03-27 DIAGNOSIS — K567 Ileus, unspecified: Secondary | ICD-10-CM | POA: Diagnosis not present

## 2018-03-27 DIAGNOSIS — Z4682 Encounter for fitting and adjustment of non-vascular catheter: Secondary | ICD-10-CM | POA: Diagnosis not present

## 2018-03-27 LAB — URINALYSIS, ROUTINE W REFLEX MICROSCOPIC
Bilirubin Urine: NEGATIVE
Glucose, UA: NEGATIVE mg/dL
Ketones, ur: NEGATIVE mg/dL
Nitrite: NEGATIVE
Protein, ur: 100 mg/dL — AB
Specific Gravity, Urine: 1.014 (ref 1.005–1.030)
WBC, UA: >50 WBC/hpf — ABNORMAL HIGH (ref 0–5)
pH: 6 (ref 5.0–8.0)

## 2018-03-29 ENCOUNTER — Other Ambulatory Visit (HOSPITAL_COMMUNITY): Payer: Medicare Other

## 2018-03-29 LAB — URINE CULTURE: Culture: >=100000 — AB

## 2018-03-30 ENCOUNTER — Other Ambulatory Visit (HOSPITAL_COMMUNITY): Payer: Medicare Other

## 2018-03-30 DIAGNOSIS — K567 Ileus, unspecified: Secondary | ICD-10-CM | POA: Diagnosis not present

## 2018-03-30 LAB — CBC
HCT: 29.4 % — ABNORMAL LOW (ref 39.0–52.0)
HEMOGLOBIN: 10.2 g/dL — AB (ref 13.0–17.0)
MCH: 29.7 pg (ref 26.0–34.0)
MCHC: 34.7 g/dL (ref 30.0–36.0)
MCV: 85.7 fL (ref 80.0–100.0)
Platelets: 198 10*3/uL (ref 150–400)
RBC: 3.43 MIL/uL — ABNORMAL LOW (ref 4.22–5.81)
RDW: 14.1 % (ref 11.5–15.5)
WBC: 5 10*3/uL (ref 4.0–10.5)
nRBC: 0 % (ref 0.0–0.2)

## 2018-03-30 LAB — BASIC METABOLIC PANEL
Anion gap: 7 (ref 5–15)
BUN: 15 mg/dL (ref 8–23)
CO2: 25 mmol/L (ref 22–32)
CREATININE: 1.73 mg/dL — AB (ref 0.61–1.24)
Calcium: 7.9 mg/dL — ABNORMAL LOW (ref 8.9–10.3)
Chloride: 107 mmol/L (ref 98–111)
GFR calc Af Amer: 41 mL/min — ABNORMAL LOW (ref >60)
GFR calc non Af Amer: 35 mL/min — ABNORMAL LOW (ref >60)
Glucose, Bld: 101 mg/dL — ABNORMAL HIGH (ref 70–99)
Potassium: 2.9 mmol/L — ABNORMAL LOW (ref 3.5–5.1)
Sodium: 139 mmol/L (ref 135–145)

## 2018-03-31 ENCOUNTER — Other Ambulatory Visit (HOSPITAL_COMMUNITY): Payer: Medicare Other

## 2018-03-31 DIAGNOSIS — K7689 Other specified diseases of liver: Secondary | ICD-10-CM | POA: Diagnosis not present

## 2018-03-31 DIAGNOSIS — N281 Cyst of kidney, acquired: Secondary | ICD-10-CM | POA: Diagnosis not present

## 2018-03-31 LAB — BASIC METABOLIC PANEL
Anion gap: 12 (ref 5–15)
BUN: 13 mg/dL (ref 8–23)
CO2: 24 mmol/L (ref 22–32)
CREATININE: 1.53 mg/dL — AB (ref 0.61–1.24)
Calcium: 8.3 mg/dL — ABNORMAL LOW (ref 8.9–10.3)
Chloride: 105 mmol/L (ref 98–111)
GFR calc Af Amer: 47 mL/min — ABNORMAL LOW (ref >60)
GFR calc non Af Amer: 41 mL/min — ABNORMAL LOW (ref >60)
Glucose, Bld: 94 mg/dL (ref 70–99)
Potassium: 3.8 mmol/L (ref 3.5–5.1)
SODIUM: 141 mmol/L (ref 135–145)

## 2018-04-03 LAB — MAGNESIUM: Magnesium: 1.5 mg/dL — ABNORMAL LOW (ref 1.7–2.4)

## 2018-04-03 LAB — BASIC METABOLIC PANEL
Anion gap: 10 (ref 5–15)
BUN: 10 mg/dL (ref 8–23)
CHLORIDE: 106 mmol/L (ref 98–111)
CO2: 23 mmol/L (ref 22–32)
Calcium: 8.3 mg/dL — ABNORMAL LOW (ref 8.9–10.3)
Creatinine, Ser: 1.58 mg/dL — ABNORMAL HIGH (ref 0.61–1.24)
GFR calc Af Amer: 45 mL/min — ABNORMAL LOW (ref >60)
GFR calc non Af Amer: 39 mL/min — ABNORMAL LOW (ref >60)
Glucose, Bld: 102 mg/dL — ABNORMAL HIGH (ref 70–99)
Potassium: 3.3 mmol/L — ABNORMAL LOW (ref 3.5–5.1)
Sodium: 139 mmol/L (ref 135–145)

## 2018-04-03 LAB — CBC
HCT: 29.6 % — ABNORMAL LOW (ref 39.0–52.0)
Hemoglobin: 10.3 g/dL — ABNORMAL LOW (ref 13.0–17.0)
MCH: 29.9 pg (ref 26.0–34.0)
MCHC: 34.8 g/dL (ref 30.0–36.0)
MCV: 85.8 fL (ref 80.0–100.0)
Platelets: 209 10*3/uL (ref 150–400)
RBC: 3.45 MIL/uL — ABNORMAL LOW (ref 4.22–5.81)
RDW: 14 % (ref 11.5–15.5)
WBC: 4.2 10*3/uL (ref 4.0–10.5)
nRBC: 0 % (ref 0.0–0.2)

## 2018-04-04 LAB — MAGNESIUM: Magnesium: 2 mg/dL (ref 1.7–2.4)

## 2018-04-04 LAB — POTASSIUM: Potassium: 3.2 mmol/L — ABNORMAL LOW (ref 3.5–5.1)

## 2018-04-21 DEATH — deceased

## 2020-09-06 IMAGING — DX DG CHEST 1V PORT
1 series · 1 of 1 positions shown · non-contrast
Comparison: 03/05/2018

CLINICAL DATA: Respiratory failure

EXAM:
PORTABLE CHEST 1 VIEW

[chest]
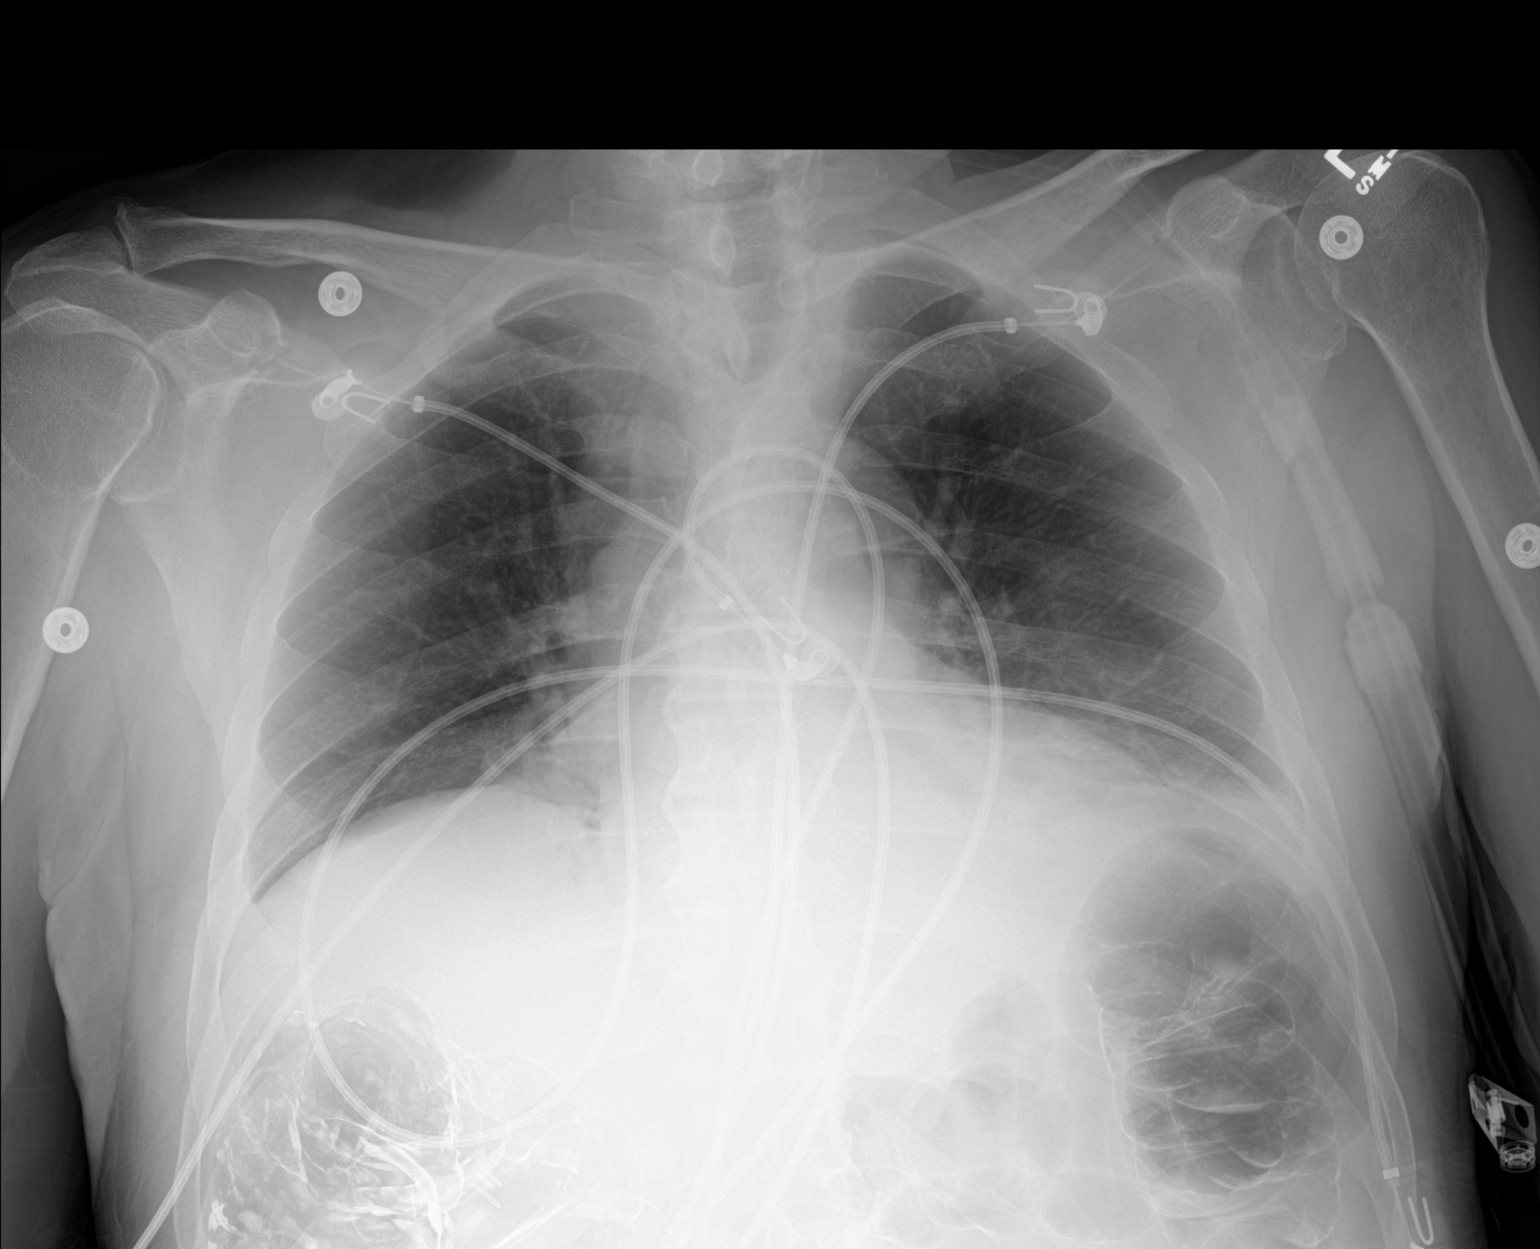

[1 of 1 positions shown; findings below may reference images not displayed]

FINDINGS: Low lung volumes. Bibasilar atelectasis. Vascular congestion. No
visible effusions or acute bony abnormality.
IMPRESSION: Low lung volumes with vascular congestion and bibasilar atelectasis.

## 2020-09-07 IMAGING — CR DG ABD PORTABLE 1V
1 series · 1 of 1 positions shown · non-contrast
Comparison: 02/23/2018.

CLINICAL DATA: 88-year-old male enteric tube placement.

EXAM:
PORTABLE ABDOMEN - 1 VIEW

[AP]
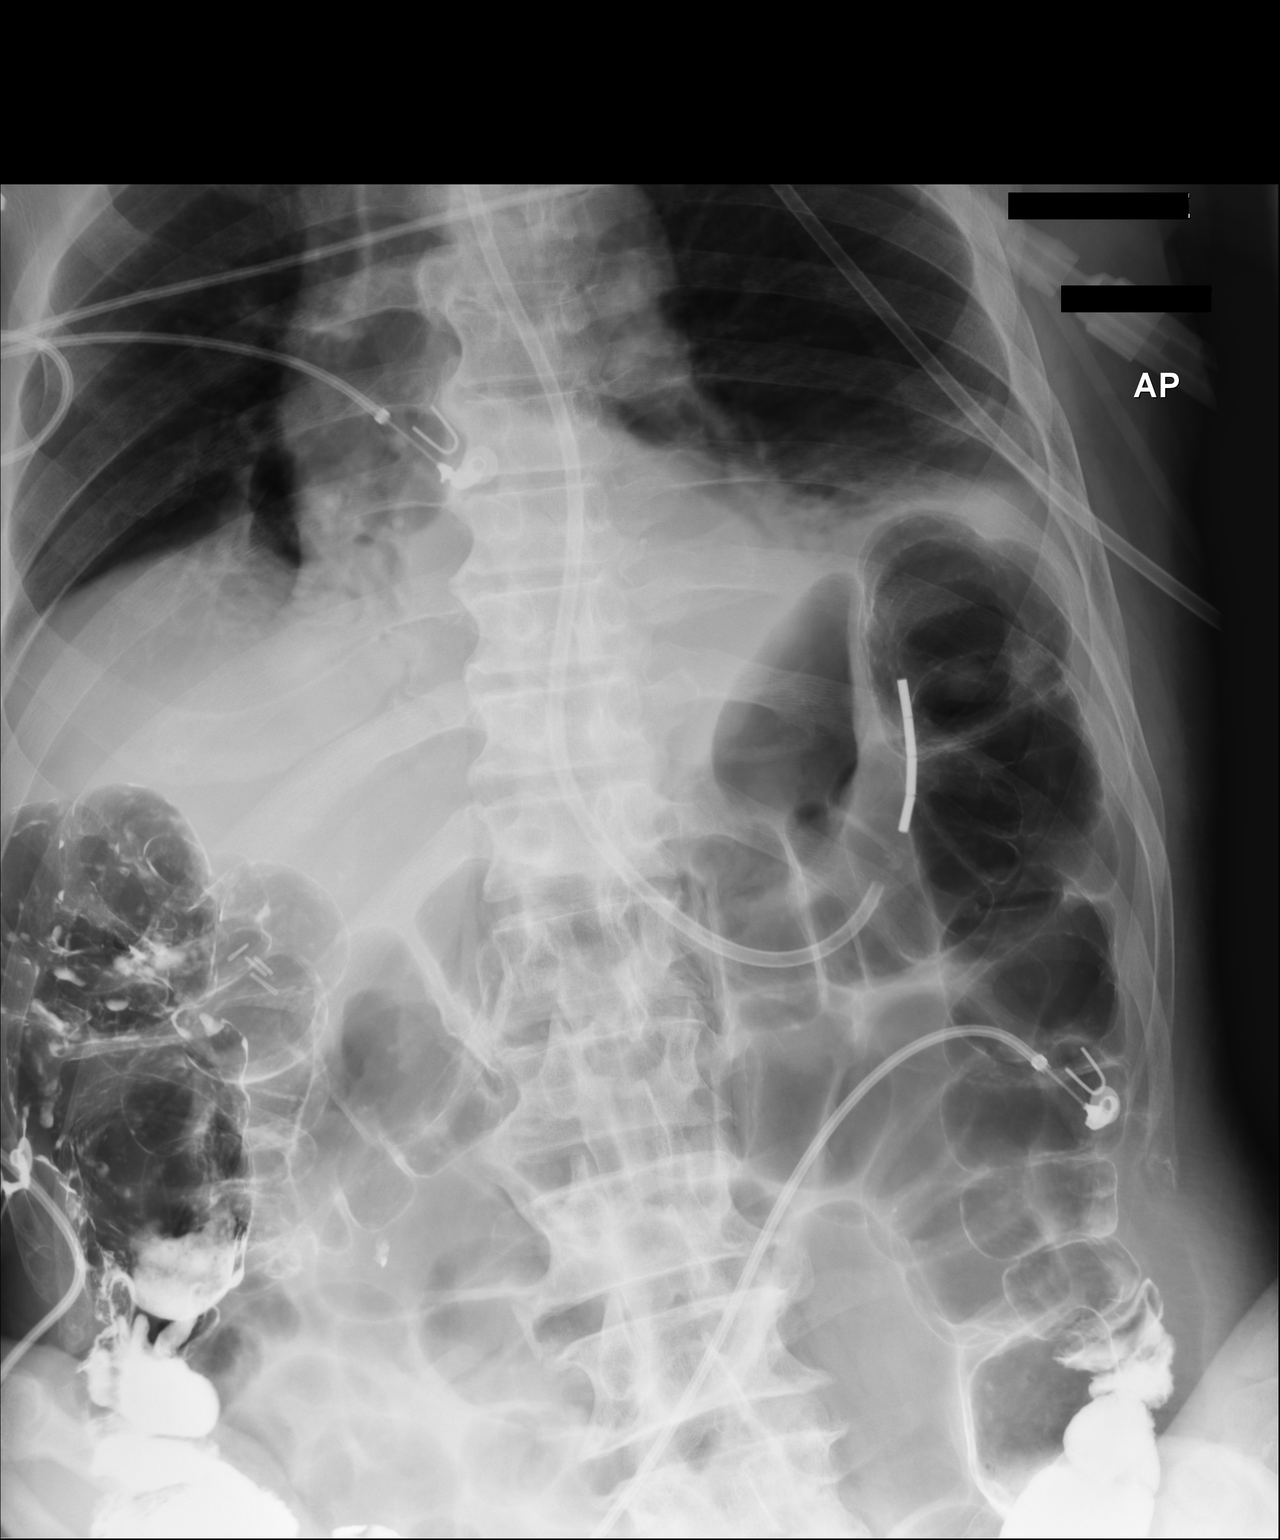

[1 of 1 positions shown; findings below may reference images not displayed]

FINDINGS: Portable AP upright view at 1714 hours. Enteric feeding tube with
radiopaque/weighted tip courses to the left upper quadrant and
terminates in the gastric body, tip directed toward the fundus.
Previously seen in G type 2 no longer present. Barium type contrast
is present in some of the large bowel. Stable lung bases.
IMPRESSION: Enteric feeding tube located at the level of the gastric body. If
post pyloric placement is desired the tube should be advanced (5-10
centimeters or so) to allow enough slack for distal transit.

## 2020-09-11 IMAGING — DX DG ABD PORTABLE 1V
1 series · 1 of 1 positions shown · non-contrast
Comparison: Two days ago

CLINICAL DATA: Follow-up ileus

EXAM:
PORTABLE ABDOMEN - 1 VIEW

[abdomen kub]
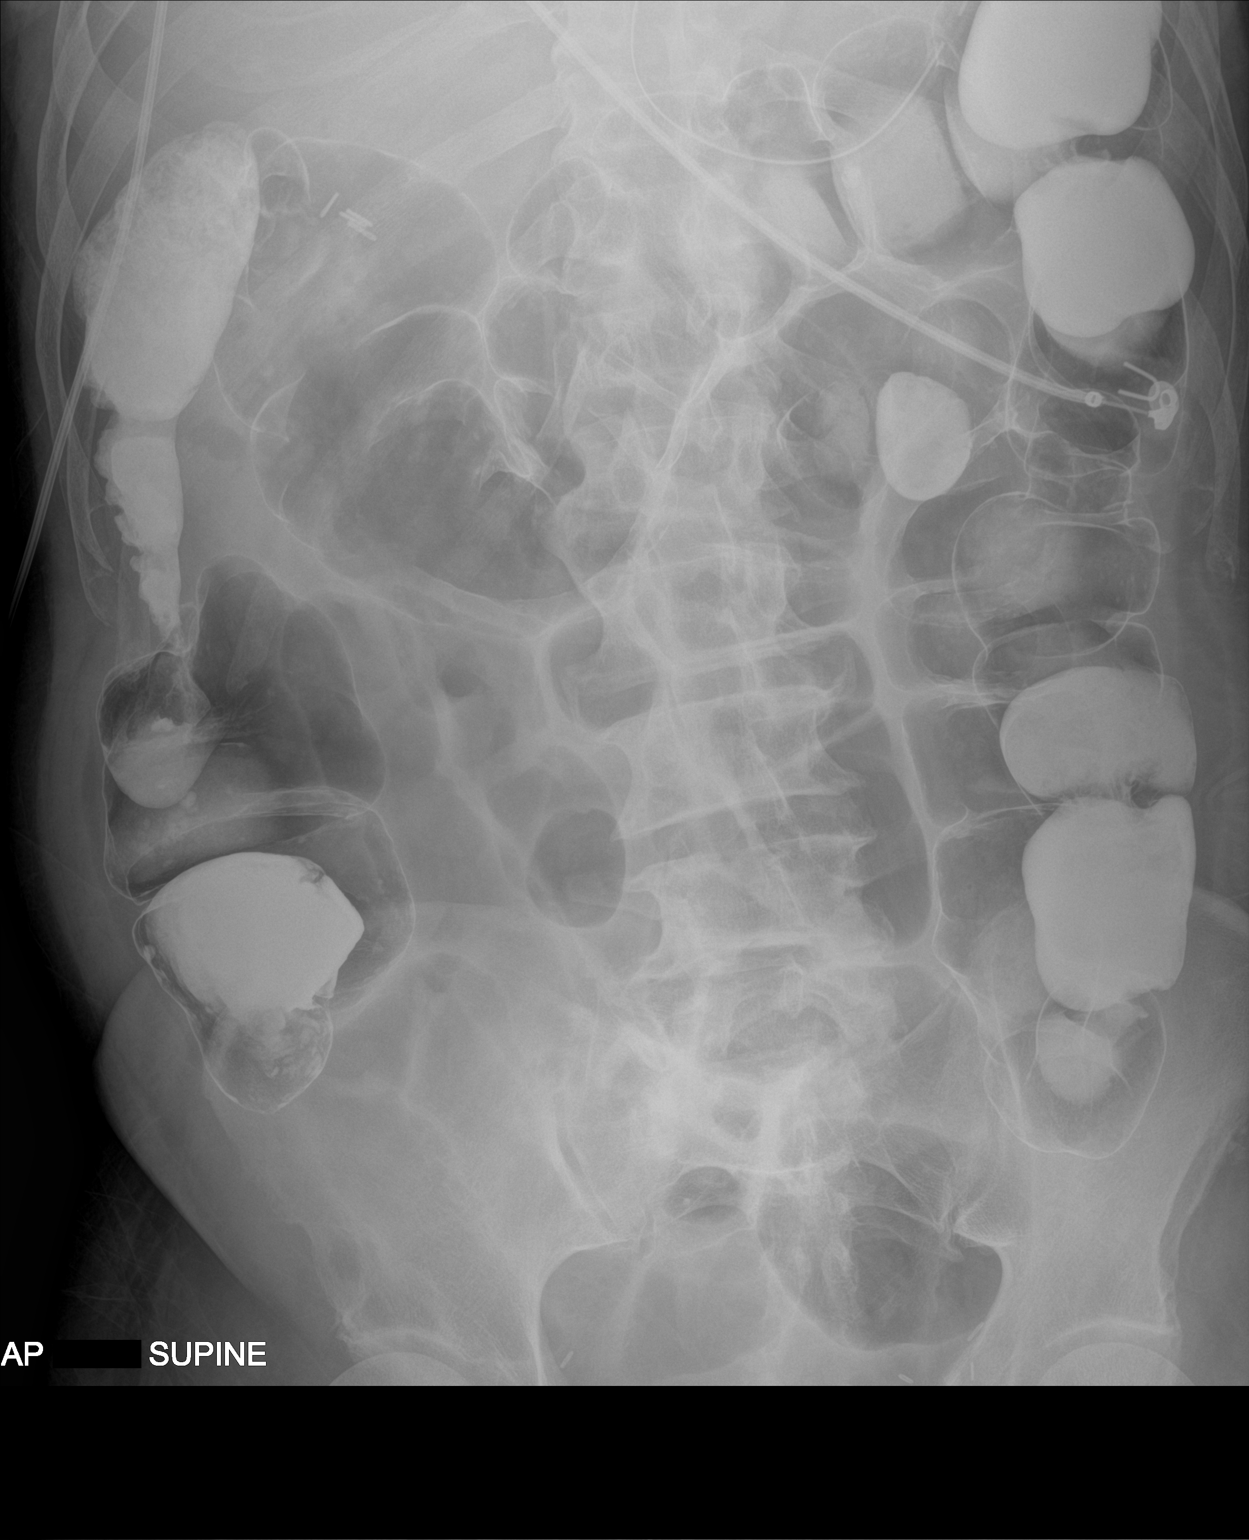

[1 of 1 positions shown; findings below may reference images not displayed]

FINDINGS: Diffuse gaseous distension of small and large bowel. Continued
retention of oral contrast within the colon. Nasogastric tube tip
terminates in the stomach. No concerning mass effect or gas
collection.
IMPRESSION: History of ileus with unchanged generalized gaseous distension of
bowel.

## 2020-09-12 IMAGING — DX DG ABD PORTABLE 1V
1 series · 1 of 1 positions shown · non-contrast
Comparison: 03/12/2018

CLINICAL DATA: Follow-up colonic impaction

EXAM:
PORTABLE ABDOMEN - 1 VIEW

[abdomen kub]
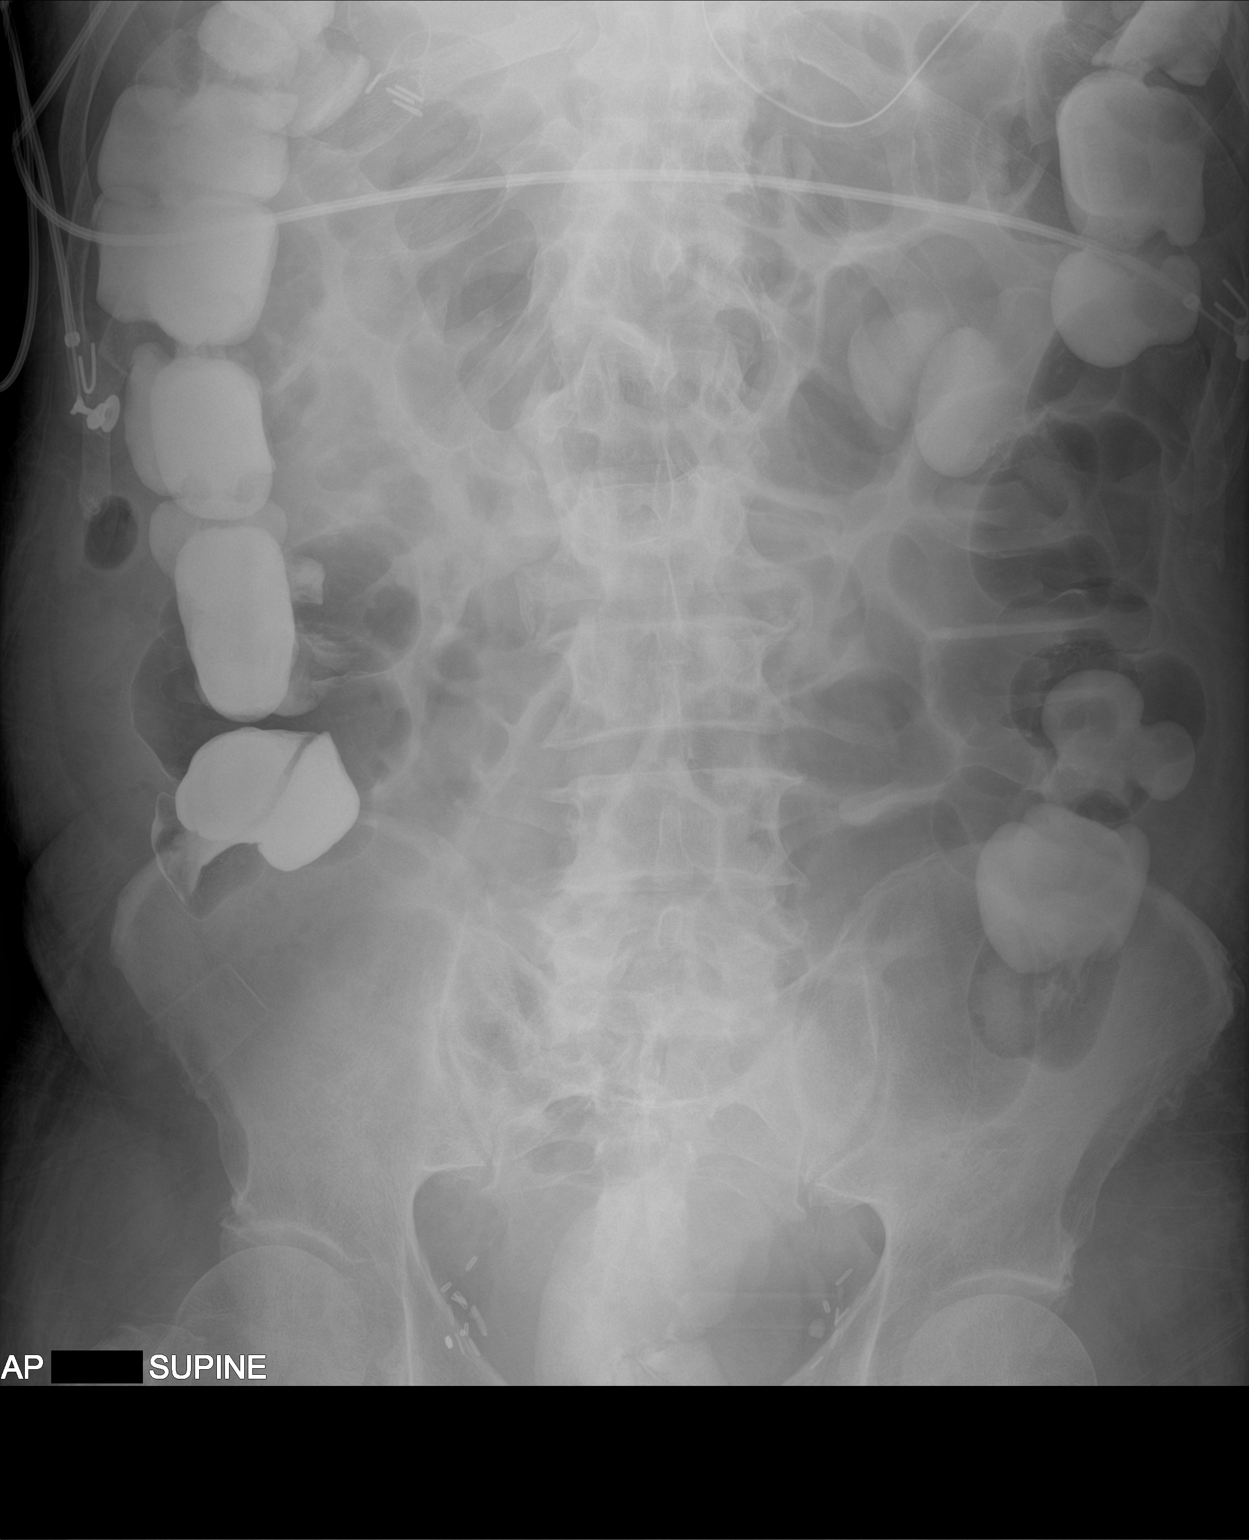

[1 of 1 positions shown; findings below may reference images not displayed]

FINDINGS: Scattered large and small bowel gas is noted. Contrast material is
again noted within the colon. No definitive obstructive changes are
seen. Findings are most consistent with stable ileus. Degenerative
changes of the lumbar spine are noted. Postsurgical changes are
noted in the pelvis.
IMPRESSION: Stable ileus.

## 2020-09-13 IMAGING — DX DG ABD PORTABLE 1V
1 series · 1 of 1 positions shown · non-contrast
Comparison: 03/13/2018

CLINICAL DATA: Ileus

EXAM:
PORTABLE ABDOMEN - 1 VIEW

[abdomen]
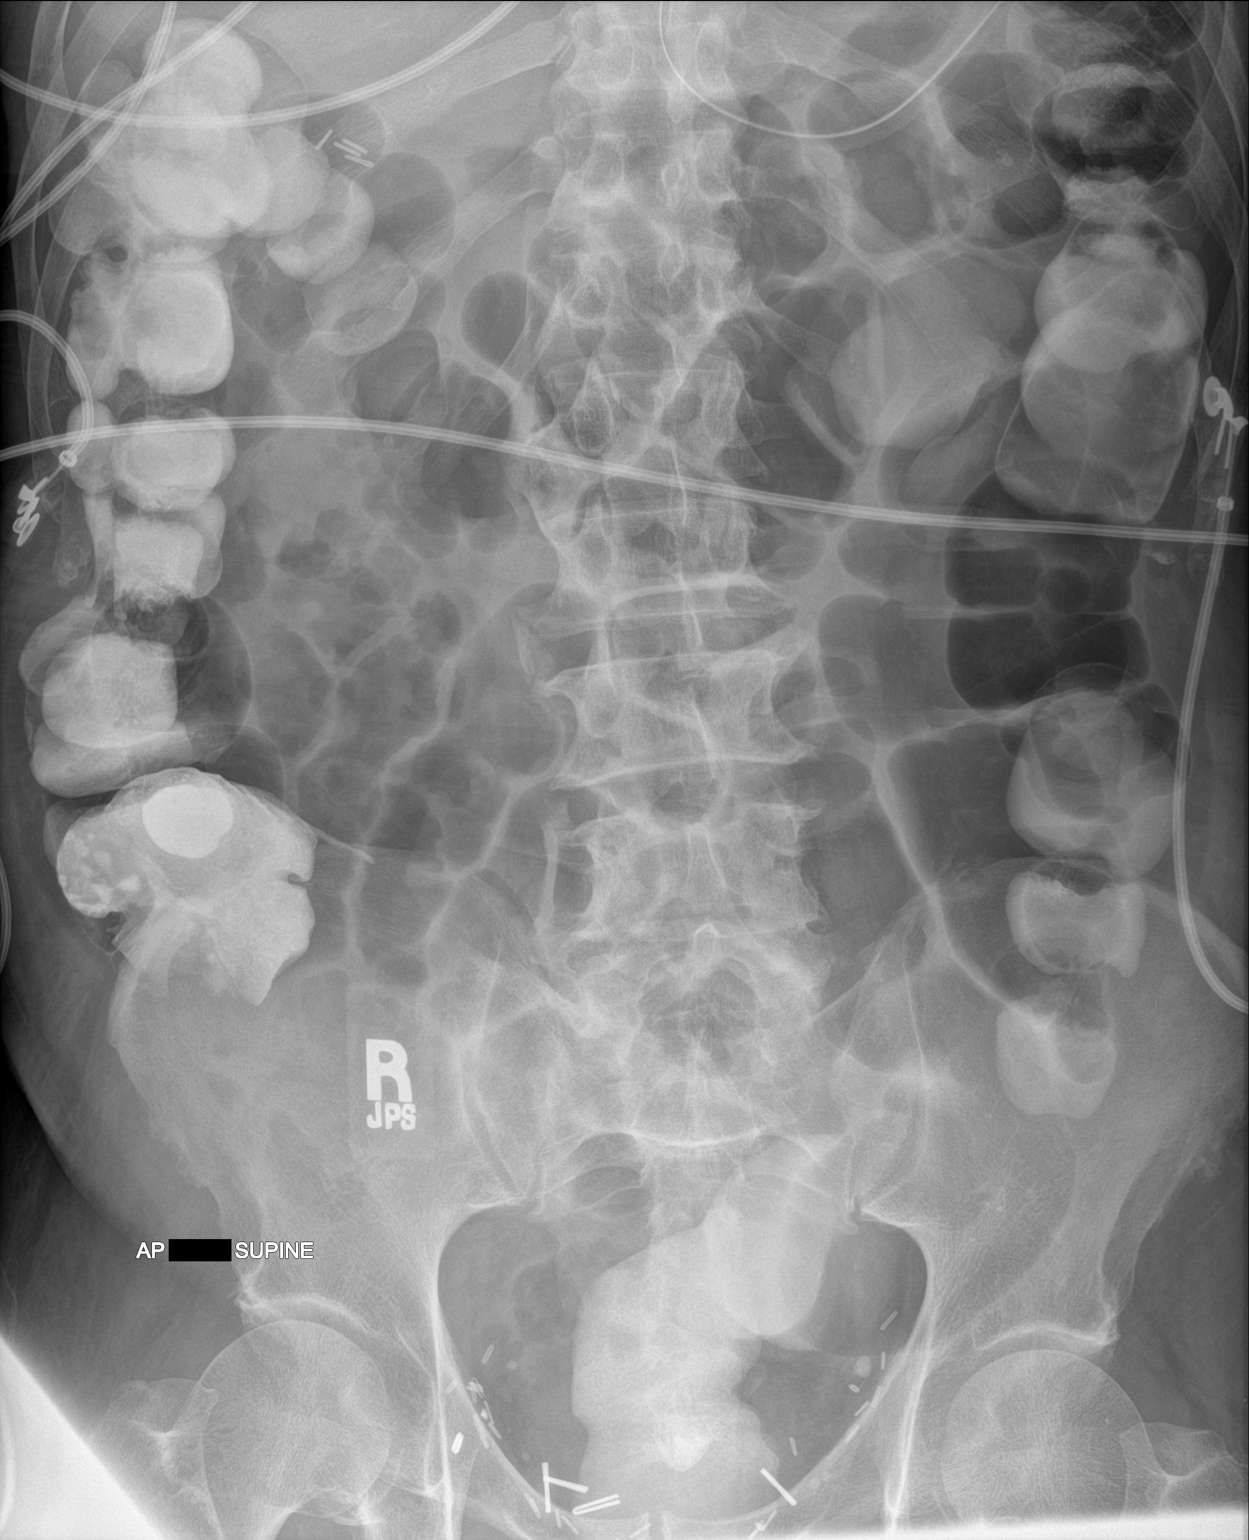

[1 of 1 positions shown; findings below may reference images not displayed]

FINDINGS: Retained contrast throughout nondistended colon.

Persistent prominence is small bowel gas as well as scattered gas
throughout colon.

No bowel dilatation or bowel wall thickening.

Bones unremarkable.

Numerous surgical clips and a few phleboliths in pelvis.

Additional surgical clips RIGHT upper quadrant question
cholecystectomy.

Suspected nasogastric tube at cranial margin of the visualized LEFT
upper quadrant
IMPRESSION: Unchanged bowel gas pattern question ileus.
